# Patient Record
Sex: Male | Born: 1960 | Race: White | Hispanic: No | Marital: Married | Smoking: Current every day smoker
Health system: Southern US, Community
[De-identification: ages and names within clinical notes are randomized; demographics above are authoritative.]

---

## 2015-05-21 ENCOUNTER — Inpatient Hospital Stay: Payer: Managed Care, Other (non HMO)

## 2015-05-21 ENCOUNTER — Observation Stay: Payer: Managed Care, Other (non HMO)

## 2015-05-21 ENCOUNTER — Inpatient Hospital Stay
Admission: EM | Admit: 2015-05-21 | Discharge: 2015-05-23 | DRG: 065 | Disposition: A | Discharge status: Home / Self Care | Payer: Managed Care, Other (non HMO) | Attending: Internal Medicine | Admitting: Internal Medicine

## 2015-05-21 ENCOUNTER — Emergency Department: Payer: Managed Care, Other (non HMO)

## 2015-05-21 ENCOUNTER — Inpatient Hospital Stay: Admit: 2015-05-21 | Payer: Managed Care, Other (non HMO)

## 2015-05-21 ENCOUNTER — Encounter: Payer: Self-pay | Admitting: *Deleted

## 2015-05-21 DIAGNOSIS — F17200 Nicotine dependence, unspecified, uncomplicated: Secondary | ICD-10-CM | POA: Diagnosis present

## 2015-05-21 DIAGNOSIS — R030 Elevated blood-pressure reading, without diagnosis of hypertension: Secondary | ICD-10-CM | POA: Diagnosis present

## 2015-05-21 DIAGNOSIS — Z716 Tobacco abuse counseling: Secondary | ICD-10-CM | POA: Diagnosis not present

## 2015-05-21 DIAGNOSIS — Z79899 Other long term (current) drug therapy: Secondary | ICD-10-CM

## 2015-05-21 DIAGNOSIS — R202 Paresthesia of skin: Secondary | ICD-10-CM

## 2015-05-21 DIAGNOSIS — I6350 Cerebral infarction due to unspecified occlusion or stenosis of unspecified cerebral artery: Secondary | ICD-10-CM | POA: Diagnosis not present

## 2015-05-21 DIAGNOSIS — E785 Hyperlipidemia, unspecified: Secondary | ICD-10-CM | POA: Diagnosis present

## 2015-05-21 DIAGNOSIS — Z8249 Family history of ischemic heart disease and other diseases of the circulatory system: Secondary | ICD-10-CM

## 2015-05-21 DIAGNOSIS — R200 Anesthesia of skin: Secondary | ICD-10-CM | POA: Diagnosis not present

## 2015-05-21 DIAGNOSIS — G8194 Hemiplegia, unspecified affecting left nondominant side: Secondary | ICD-10-CM | POA: Diagnosis present

## 2015-05-21 DIAGNOSIS — I639 Cerebral infarction, unspecified: Secondary | ICD-10-CM | POA: Diagnosis not present

## 2015-05-21 DIAGNOSIS — G459 Transient cerebral ischemic attack, unspecified: Secondary | ICD-10-CM

## 2015-05-21 DIAGNOSIS — I6340 Cerebral infarction due to embolism of unspecified cerebral artery: Secondary | ICD-10-CM | POA: Diagnosis not present

## 2015-05-21 LAB — BASIC METABOLIC PANEL
Anion gap: 9 (ref 5–15)
BUN: 12 mg/dL (ref 6–20)
CHLORIDE: 108 mmol/L (ref 101–111)
CO2: 21 mmol/L — AB (ref 22–32)
Calcium: 9.2 mg/dL (ref 8.9–10.3)
Creatinine, Ser: 1.06 mg/dL (ref 0.61–1.24)
GFR calc non Af Amer: >60 mL/min (ref >60)
Glucose, Bld: 115 mg/dL — ABNORMAL HIGH (ref 65–99)
POTASSIUM: 4.4 mmol/L (ref 3.5–5.1)
SODIUM: 138 mmol/L (ref 135–145)

## 2015-05-21 LAB — LIPID PANEL
CHOLESTEROL: 185 mg/dL (ref 0–200)
HDL: 37 mg/dL — ABNORMAL LOW (ref >40)
LDL Cholesterol: 108 mg/dL — ABNORMAL HIGH (ref 0–99)
TRIGLYCERIDES: 198 mg/dL — AB (ref <150)
Total CHOL/HDL Ratio: 5 RATIO
VLDL: 40 mg/dL (ref 0–40)

## 2015-05-21 LAB — CBC
HCT: 48.2 % (ref 40.0–52.0)
HEMOGLOBIN: 16.9 g/dL (ref 13.0–18.0)
MCH: 34.1 pg — ABNORMAL HIGH (ref 26.0–34.0)
MCHC: 35.2 g/dL (ref 32.0–36.0)
MCV: 96.9 fL (ref 80.0–100.0)
PLATELETS: 194 10*3/uL (ref 150–440)
RBC: 4.97 MIL/uL (ref 4.40–5.90)
RDW: 13.5 % (ref 11.5–14.5)
WBC: 10.5 10*3/uL (ref 3.8–10.6)

## 2015-05-21 LAB — TROPONIN I

## 2015-05-21 MED ORDER — HYDRALAZINE HCL 20 MG/ML IJ SOLN: 10.0000 mg | Freq: Four times a day (QID) | INTRAMUSCULAR | Status: DC | PRN

## 2015-05-21 MED ORDER — SENNOSIDES-DOCUSATE SODIUM 8.6-50 MG PO TABS: 1.0000 | ORAL_TABLET | Freq: Every evening | ORAL | Status: DC | PRN

## 2015-05-21 MED ORDER — ASPIRIN 300 MG RE SUPP: 300.0000 mg | Freq: Every day | RECTAL | Status: DC

## 2015-05-21 MED ORDER — IOPAMIDOL (ISOVUE-370) INJECTION 76%
75.0000 mL | Freq: Once | INTRAVENOUS | Status: AC | PRN
  Administered 2015-05-21: 75 mL via INTRAVENOUS

## 2015-05-21 MED ORDER — GADOBENATE DIMEGLUMINE 529 MG/ML IV SOLN
20.0000 mL | Freq: Once | INTRAVENOUS | Status: AC | PRN
  Administered 2015-05-21: 18 mL via INTRAVENOUS

## 2015-05-21 MED ORDER — ENOXAPARIN SODIUM 40 MG/0.4ML ~~LOC~~ SOLN
40.0000 mg | SUBCUTANEOUS | Status: DC
Start: 2015-05-21 — End: 2015-05-23
  Administered 2015-05-21 – 2015-05-22 (×2): 40 mg via SUBCUTANEOUS
  Filled 2015-05-21 (×2): qty 0.4

## 2015-05-21 MED ORDER — ASPIRIN 325 MG PO TABS
325.0000 mg | ORAL_TABLET | Freq: Every day | ORAL | Status: DC
Start: 2015-05-21 — End: 2015-05-23
  Administered 2015-05-22 – 2015-05-23 (×2): 325 mg via ORAL
  Filled 2015-05-21 (×2): qty 1

## 2015-05-21 MED ORDER — SODIUM CHLORIDE 0.9 % IV SOLN
Freq: Once | INTRAVENOUS | Status: DC
Start: 2015-05-21 — End: 2015-05-21

## 2015-05-21 MED ORDER — SODIUM CHLORIDE 0.9 % IV SOLN
INTRAVENOUS | Status: DC
  Administered 2015-05-21 – 2015-05-22 (×2): via INTRAVENOUS

## 2015-05-21 MED ORDER — STROKE: EARLY STAGES OF RECOVERY BOOK
Freq: Once | Status: AC
  Administered 2015-05-21: 20:00:00

## 2015-05-21 MED ORDER — ATORVASTATIN CALCIUM 20 MG PO TABS
40.0000 mg | ORAL_TABLET | Freq: Every day | ORAL | Status: DC
  Administered 2015-05-21 – 2015-05-22 (×2): 40 mg via ORAL
  Filled 2015-05-21 (×2): qty 2

## 2015-05-21 MED ORDER — ASPIRIN 81 MG PO CHEW
324.0000 mg | CHEWABLE_TABLET | Freq: Once | ORAL | Status: AC
  Administered 2015-05-21: 324 mg via ORAL
  Filled 2015-05-21: qty 4

## 2015-05-21 NOTE — ED Notes (Signed)
Pt hypertensive MD made aware. 

## 2015-05-21 NOTE — ED Provider Notes (Addendum)
[facility] Emergency Department Provider Note   ____________________________________________  Time seen: Approximately 3:23 PM  I have reviewed the triage vital signs and the nursing notes.   HISTORY  Chief Complaint Numbness    HPI Victor Morrison is a 55 y.o. male who reports she's had intermittent trouble with his left hand it feeling numb and clumsy or at times just will not work for him. Friday night he had an episode where his legs became really really weak and he said he was walking on stilts they really wouldn't work properly he fell onto his bed and laid there and when he tried to turn over and touch the touch lamp with his left hand wouldn't work either. This is all improved a lot. Right now he complains of a little stiff feeling in his left hand. Otherwise is normal he's had some pain in his head above his right eye as well. He tells me he's had a little bit of stiffness in his hand today and some chest pain in the past but none at present.  History reviewed. No pertinent past medical history.  There are no active problems to display for this patient.   History reviewed. No pertinent past surgical history.  No current outpatient prescriptions on file.  Allergies Review of patient's allergies indicates no known allergies.  History reviewed. No pertinent family history.  Social History Social History  Substance Use Topics  . Smoking status: Current Every Day Smoker  . Smokeless tobacco: None  . Alcohol Use: None    Review of Systems Constitutional: No fever/chills Eyes: No visual changes. ENT: No sore throat. Cardiovascular: See history of present illness Respiratory: Denies shortness of breath. Gastrointestinal: No abdominal pain.  No nausea, no vomiting.  No diarrhea.  No constipation. Genitourinary: Negative for dysuria. Musculoskeletal: Negative for back pain. Skin: Negative for rash. Neurological: See history of present  illness  10-point ROS otherwise negative.  ____________________________________________   PHYSICAL EXAM:  VITAL SIGNS: ED Triage Vitals  Enc Vitals Group     BP 05/21/15 1014 158/85 mmHg     Pulse Rate 05/21/15 1014 93     Resp 05/21/15 1014 18     Temp 05/21/15 1017 97.8 F (36.6 C)     Temp Source 05/21/15 1014 Oral     SpO2 05/21/15 1014 98 %     Weight 05/21/15 1014 190 lb (86.183 kg)     Height 05/21/15 1014  (1.956 m)     Head Cir --      Peak Flow --      Pain Score 05/21/15 1015 5     Pain Loc --      Pain Edu? --      Excl. in GC? --     Constitutional: Alert and oriented. Well appearing and in no acute distress. Eyes: Conjunctivae are normal. PERRL. EOMI. Fundi somewhat difficult to see but appear normal Head: Atraumatic. Nose: No congestion/rhinnorhea. Mouth/Throat: Mucous membranes are moist.  Oropharynx non-erythematous. Neck: No stridor.  Cardiovascular: Normal rate, regular rhythm. Grossly normal heart sounds.  Good peripheral circulation. Respiratory: Normal respiratory effort.  No retractions. Lungs CTAB. Gastrointestinal: Soft and nontender. No distention. No abdominal bruits. No CVA tenderness. Musculoskeletal: No lower extremity tenderness nor edema.  No joint effusions. Neurologic:  Normal speech and language. No gross focal neurologic deficits are appreciated. Cranial nerves II through XII are intact cerebellar rapid alternating movement in the left hand is slightly slowed patient reports this  is because of stiffness right hand is normal finger to nose and heel to shin are normal bilaterally motor strength is 5 over 5 throughout sensation is intact except for his left index finger feels a little bit numb at the present time. No gait instability. Skin:  Skin is warm, dry and intact. No rash noted. Psychiatric: Mood and affect are normal. Speech and behavior are normal.  ____________________________________________   LABS (all labs ordered are  listed, but only abnormal results are displayed)  Labs Reviewed  BASIC METABOLIC PANEL - Abnormal; Notable for the following:    CO2 21 (*)    Glucose, Bld 115 (*)    All other components within normal limits  CBC - Abnormal; Notable for the following:    MCH 34.1 (*)    All other components within normal limits  TROPONIN I   ____________________________________________  EKG  EKG read and interpreted by me shows normal sinus rhythm at a rate of 77 normal axis essentially normal EKG ____________________________________________  RADIOLOGY  CT the head read as no acute pathology per etiology Chest x-ray shows no acute pathology only emphysema per radiology. ____________________________________________   PROCEDURES   ____________________________________________   INITIAL IMPRESSION / ASSESSMENT AND PLAN / ED COURSE  Pertinent labs & imaging results that were available during my care of the patient were reviewed by me and considered in my medical decision making (see chart for details).  Discussed patient with Dr. Reynolds, Thad Rangereynolds wants to make sure Thad Rangerthat the patient did not have a shower of emboli. ____________________________________________   FINAL CLINICAL IMPRESSION(S) / ED DIAGNOSES  Final diagnoses:  Transient cerebral ischemia, unspecified transient cerebral ischemia type      NEW MEDICATIONS STARTED DURING THIS VISIT:  New Prescriptions   No medications on file     Note:  This document was prepared using Dragon voice recognition software and may include unintentional dictation errors.    Arnaldo NatalPaul F Malinda, MD 05/21/15 1533  Arnaldo NatalPaul F Malinda, MD 05/21/15 [phone]

## 2015-05-21 NOTE — ED Notes (Signed)
States since Thursday pt has had intermentent episodes of left hand numbness and weakness, states Friday night he lost complete control of leg muscles and fell into the bed, also states right eye pain and chest tightness, states dizziness and headache, pt awake and alert, appears in no acute distress

## 2015-05-21 NOTE — ED Notes (Signed)
Pt to CT

## 2015-05-21 NOTE — H&P (Signed)
[facility] - [place] at [facility]   PATIENT NAME: Victor Morrison    MR#:  [id number]  DATE OF BIRTH:  1960-05-26  DATE OF ADMISSION:  05/21/2015  PRIMARY CARE PHYSICIAN: No primary care provider on file.   REQUESTING/REFERRING PHYSICIAN: Dr Darnelle Catalan  CHIEF COMPLAINT:   On and off numbness in the hand and both legs last several days HISTORY OF PRESENT ILLNESS:  [name]  is a 55 y.o. male with a known history of tobacco abuse comes to the emergency room with increasing frequency of numbness in both upper and lower extremities. Patient states he is right-handed and he has been noticing weakness and alternate hands to the point he is not able to do fine motor activity for a few seconds when these episodes happen. 2 days ago he had an episode where his legs both became numb fats like a log and was difficult to lift and walk. The symptoms lasted for 40-50 seconds.  Came to the emergency room CT head was negative. Patient was keen on going home did an MRI of the brain which shows multiple small acute infarcts in the right hemisphere. He is being admitted with acute CVA for further evaluation and management.  Patient received aspirin.   PAST MEDICAL HISTORY:  History reviewed. No pertinent past medical history.  PAST SURGICAL HISTOIRY:  History reviewed. No pertinent past surgical history.  SOCIAL HISTORY:   Social History  Substance Use Topics  . Smoking status: Current Every Day Smoker  . Smokeless tobacco: Not on file  . Alcohol Use: Not on file    FAMILY HISTORY:   Family History  Problem Relation Age of Onset  . Hypertension Father     DRUG ALLERGIES:  No Known Allergies  REVIEW OF SYSTEMS:  Review of Systems  Constitutional: Negative for fever, chills and weight loss.  HENT: Negative for ear discharge, ear pain and nosebleeds.   Eyes: Negative for blurred vision, pain and discharge.  Respiratory: Negative for sputum production, shortness  of breath, wheezing and stridor.   Cardiovascular: Negative for chest pain, palpitations, orthopnea and PND.  Gastrointestinal: Negative for nausea, vomiting, abdominal pain and diarrhea.  Genitourinary: Negative for urgency and frequency.  Musculoskeletal: Negative for back pain and joint pain.  Neurological: Positive for tingling and sensory change. Negative for speech change, focal weakness and weakness.  Psychiatric/Behavioral: Negative for depression and hallucinations. The patient is not nervous/anxious.   All other systems reviewed and are negative.    MEDICATIONS AT HOME:   Prior to Admission medications   Medication Sig Start Date End Date Taking? Authorizing Provider  Chlorphen-Pseudoephed-APAP (CORICIDIN D PO) Take 1 tablet by mouth daily as needed (for cough/congestion).   Yes Historical Provider, MD      VITAL SIGNS:  Blood pressure 134/98, pulse 69, temperature 97.8 F (36.6 C), temperature source Oral, resp. rate 19, height  (1.956 m), weight 86.183 kg (190 lb), SpO2 98 %.  PHYSICAL EXAMINATION:  GENERAL:  55 y.o.-year-old patient lying in the bed with no acute distress.  EYES: Pupils equal, round, reactive to light and accommodation. No scleral icterus. Extraocular muscles intact.  HEENT: Head atraumatic, normocephalic. Oropharynx and nasopharynx clear.  NECK:  Supple, no jugular venous distention. No thyroid enlargement, no tenderness.  LUNGS: Normal breath sounds bilaterally, no wheezing, rales,rhonchi or crepitation. No use of accessory muscles of respiration.  CARDIOVASCULAR: S1, S2 normal. No murmurs, rubs, or gallops.  ABDOMEN: Soft, nontender, nondistended. Bowel sounds present. No organomegaly  or mass.  EXTREMITIES: No pedal edema, cyanosis, or clubbing.  NEUROLOGIC: Cranial nerves II through XII are intact. Muscle strength 5/5 in all extremities. Sensation intact. Gait not checked.  PSYCHIATRIC: The patient is alert and oriented x 3.  SKIN: No obvious  rash, lesion, or ulcer.   LABORATORY PANEL:   CBC  Recent Labs Lab 05/21/15 1017  WBC 10.5  HGB 16.9  HCT 48.2  PLT 194   ------------------------------------------------------------------------------------------------------------------  Chemistries   Recent Labs Lab 05/21/15 1017  NA 138  K 4.4  CL 108  CO2 21*  GLUCOSE 115*  BUN 12  CREATININE 1.06  CALCIUM 9.2   ------------------------------------------------------------------------------------------------------------------  Cardiac Enzymes  Recent Labs Lab 05/21/15 1017  TROPONINI <0.03   ------------------------------------------------------------------------------------------------------------------  RADIOLOGY:  Dg Eye Foreign Body  05/21/2015  CLINICAL DATA:  Metal working/exposure; clearance prior to MRI EXAM: ORBITS FOR FOREIGN BODY - 2 VIEW COMPARISON:  CT scan of the head 05/21/2015 FINDINGS: There is no evidence of metallic foreign body within the orbits. No significant bone abnormality identified. IMPRESSION: No evidence of metallic foreign body within the orbits. Electronically Signed   By: Natasha Mead M.D.   On: 05/21/2015 16:24   Dg Chest 2 View  05/21/2015  CLINICAL DATA:  Chest pain. Dizziness and headache. Left hand numbness and weakness. EXAM: CHEST  2 VIEW COMPARISON:  None. FINDINGS: The heart size and mediastinal contours are within normal limits. The lungs are hyperinflated with flattening of diaphragm suggesting emphysema. No infiltrates or effusions. Surgical staples in the right middle lobe. No acute bone abnormality. IMPRESSION: No acute abnormality.  Emphysema. Electronically Signed   By: Francene Boyers M.D.   On: 05/21/2015 11:18   Ct Head Wo Contrast  05/21/2015  CLINICAL DATA:  Intermittent episodes of left hand numbness and weakness since 05/17/2015. The patient lost control of his legs resulting in a fall on 05/18/2015. Initial encounter. EXAM: CT HEAD WITHOUT CONTRAST TECHNIQUE:  Contiguous axial images were obtained from the base of the skull through the vertex without intravenous contrast. COMPARISON:  None. FINDINGS: The brain appears normal without hemorrhage, infarct, mass lesion, mass effect, midline shift or abnormal extra-axial fluid collection. No hydrocephalus or pneumocephalus. The calvarium is intact. Imaged paranasal sinuses and mastoid air cells are clear. IMPRESSION: Negative head CT. Electronically Signed   By: Drusilla Kanner M.D.   On: 05/21/2015 11:14   Mr Lodema Pilot Contrast  05/21/2015  CLINICAL DATA:  55 year old male Male with intermittent left hand clumsiness. Recent episode of lower extremity weakness. Numbness and tingling. Symptoms reportedly since 05/17/2015. Initial encounter. EXAM: MRI HEAD WITHOUT AND WITH CONTRAST TECHNIQUE: Multiplanar, multiecho pulse sequences of the brain and surrounding structures were obtained without and with intravenous contrast. CONTRAST:  18mL MULTIHANCE GADOBENATE DIMEGLUMINE 529 MG/ML IV SOLN COMPARISON:  Head CT without contrast 1045 hours today. FINDINGS: Numerous small areas of cortical and white matter restricted diffusion in the right hemisphere. There is involvement of all lobes of the right hemisphere. The deep gray matter is relatively spared, although there is subtle T2 and diffusion abnormality in the right caudate nucleus. Associated T2 and FLAIR hyperintensity without evidence of hemorrhage. No mass effect at this time. No contralateral left hemisphere or posterior fossa diffusion abnormality. Major intracranial vascular flow voids are preserved, however, there is suggestion of some abnormal FLAIR signal in right MCA branches on series 9, image 12. Furthermore, there is asymmetric flow related signal in the right ICA on precontrast T1 imaging. Following contrast there  is no abnormal enhancement. No dural thickening. No ventriculomegaly. No intracranial mass lesion. Outside of the acute findings above gray and white  matter signal appears within normal limits. Negative pituitary, thalami, brainstem, cerebellum, and cervicomedullary junction. No cortical encephalomalacia or chronic cerebral blood products identified. Visible internal auditory structures appear normal. Negative paranasal sinuses and mastoids. Postoperative changes to both globes. Negative scalp soft tissues. Normal bone marrow signal. Negative for age visualized cervical spine. IMPRESSION: 1. Scattered small acute infarcts throughout the right hemisphere. No associated hemorrhage or mass effect. 2. Suggestion of abnormal flow in the right ICA and MCA on these images. CTA head and neck would evaluate further. Electronically Signed   By: Odessa FlemingH  Hall M.D.   On: 05/21/2015 17:18    EKG:  NSR  IMPRESSION AND PLAN:  Jameil Mcmahen  is a 55 y.o. male with a known history of tobacco abuse comes to the emergency room with increasing frequency of numbness in both upper and lower extremities. Patient states he is right-handed and he has been noticing weakness and alternate hands to the point he is not able to do fine motor activity for a few seconds when these episodes happen. 2 days ago he had an episode where his legs both became numb fats like a log and was difficult to lift and walk. The symptoms lasted for 40-50 seconds.  1. acute CVA -Patient came in with episode of intermittent numbness in both upper and lower extremity last several days. -MRI suggestive of small multiple infarcts in the right hemisphere. Admit to medical floor with telemetry monitoring -Daily aspirin -We'll start empirically on Lipitor -Neurology consultation  2. Elevated blood pressure without diagnosis of hypertension -We'll allow permissive hypertension at present in the setting of acute stroke -When necessary IV hydralazine if systolic greater than 180 and diastolic greater than 105  3. Tobacco abuse Smoking cessation advice. About 4 minutes spent  4. DVT prophylaxis  subcutaneous Lovenox    All the records are reviewed and case discussed with ED provider. Management plans discussed with the patient, family and they are in agreement.  CODE STATUS: FULL TOTAL TIME TAKING CARE OF THIS PATIENT: 50 minutes.    Janeene Sand M.D on 05/21/2015 at 5:45 PM  Between 7am to 6pm - Pager - [phone]  After 6pm go to [email or web address] - password [email or web address] [facility]  [facility]  Office  [phone]  CC: Primary care physician; No primary care provider on file.

## 2015-05-22 ENCOUNTER — Inpatient Hospital Stay: Payer: Managed Care, Other (non HMO)

## 2015-05-22 ENCOUNTER — Inpatient Hospital Stay (HOSPITAL_COMMUNITY)
Admit: 2015-05-22 | Discharge: 2015-05-22 | Disposition: A | Discharge status: Home / Self Care | Payer: Managed Care, Other (non HMO) | Attending: Internal Medicine | Admitting: Internal Medicine

## 2015-05-22 DIAGNOSIS — I6350 Cerebral infarction due to unspecified occlusion or stenosis of unspecified cerebral artery: Secondary | ICD-10-CM

## 2015-05-22 DIAGNOSIS — I639 Cerebral infarction, unspecified: Secondary | ICD-10-CM

## 2015-05-22 LAB — LIPID PANEL
CHOLESTEROL: 175 mg/dL (ref 0–200)
HDL: 27 mg/dL — ABNORMAL LOW (ref >40)
LDL Cholesterol: 96 mg/dL (ref 0–99)
Total CHOL/HDL Ratio: 6.5 RATIO
Triglycerides: 260 mg/dL — ABNORMAL HIGH (ref <150)
VLDL: 52 mg/dL — AB (ref 0–40)

## 2015-05-22 LAB — ECHOCARDIOGRAM COMPLETE
Height: 77 in
WEIGHTICAEL: 3184 [oz_av]

## 2015-05-22 MED ORDER — ACETAMINOPHEN 325 MG PO TABS
650.0000 mg | ORAL_TABLET | Freq: Three times a day (TID) | ORAL | Status: DC | PRN
  Administered 2015-05-22: 07:00:00 650 mg via ORAL
  Filled 2015-05-22: qty 2

## 2015-05-22 NOTE — Progress Notes (Signed)
*  PRELIMINARY RESULTS* °Echocardiogram °2D Echocardiogram has been performed. ° °Victor Morrison °05/22/2015, 9:53 AM °

## 2015-05-22 NOTE — Care Management (Signed)
Presented to the emergency room with the diagnosis of acute CVA. Lives with wife, Maxie BetterLila [phone]( [phone]). [place] [employer] [place].  Takes care of all basic and instrumental activities of daily living himself, drives. [place]. States he has no primary care physician at home. Encouraged to obtain primary care physician when he returns home. 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 Serena CroissantBrenda S Carah Barrientes RN MSN [facility] Care Management [phone]

## 2015-05-22 NOTE — Evaluation (Signed)
Physical Therapy Evaluation Patient Details Name: Victor Morrison MRN: [id number] DOB: 08/22/1960 Today's Date: 05/22/2015   History of Present Illness  Victor Morrison is a 55 y.o. male with a known history of tobacco abuse comes to the emergency room with increasing frequency of numbness in both upper and lower extremities. Patient states he is right-handed and he has been noticing weakness and alternate hands to the point he is not able to do fine motor activity for a few seconds when these episodes happen. 2 days ago he had an episode where his legs both became numb and felt like a  log and was difficult to lift and walk. The symptoms lasted for 40-50 seconds. MRI positive for several small infarcts in R hemisphere of brain most likely from MCA branch.  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  Morrison lives with his wife (who is currently in [place]) in 1 level home with level entry.  Currently Morrison is independent with bed mobility and transfers and SBA to CGA with ambulation without AD.  Morrison demonstrates increased R LE external rotation (compared to L LE) and occasionally catching R heel during swing phase (Morrison reports this is new from baseline).  Morrison also demonstrates higher level balance deficits with balance testing.  Morrison would benefit from skilled Morrison to address noted impairments and functional limitations.  Recommend Morrison discharge to home with follow up with PCP for OP Morrison for higher level balance when medically appropriate.     Follow Up Recommendations  (Follow-up with PCP for OP Morrison for higher level balance)    Equipment Recommendations  None recommended by Morrison    Recommendations for Other Services       Precautions / Restrictions Precautions Precautions:  (moderate fall risk) Restrictions Weight Bearing Restrictions: No      Mobility  Bed Mobility Overal bed mobility: Independent             General bed  mobility comments: Supine to/from sit  Transfers Overall transfer level: Independent Equipment used: None             General transfer comment: steady without loss of balance  Ambulation/Gait Ambulation/Gait assistance: Supervision;Min guard Ambulation Distance (Feet): 360 Feet Assistive device: None Gait Pattern/deviations: Step-through pattern   Gait velocity interpretation: at or above normal speed for age/gender General Gait Details: increased lateral sway B; R LE external rotation and occasionally catching R heel during swing phase  Stairs            Wheelchair Mobility    Modified Rankin (Stroke Patients Only)       Balance Overall balance assessment: Needs assistance Sitting-balance support: No upper extremity supported;Feet supported Sitting balance-Leahy Scale: Normal     Standing balance support: No upper extremity supported Standing balance-Leahy Scale: Good    Ambulation with head turns R/L (lateral loss of balance B but able to self correct) and head turns up/down (steady). Ambulation increasing/decreasing speed with limited change in speed but steady. Mild loss of balance with ambulation and turning 180 degrees and stopping (Morrison able to self correct).               Standardized Balance Assessment Standardized Balance Assessment :  (Tinetti balance assessment:  26/28 (1/2 foot clearance; 1/2 turning 360 degrees d/t loss of balance backwards))           Pertinent Vitals/Pain Pain Assessment: No/denies pain  Vitals (HR and O2)  stable and WFL throughout treatment session.     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  His wife is currently in [place].     Hand Dominance   Dominant Hand: Right    Extremity/Trunk Assessment   Upper Extremity Assessment: Defer to OT evaluation           Lower Extremity Assessment: RLE deficits/detail;LLE deficits/detail (intact B LE sensation and coordination and proprioception) RLE Deficits / Details: 4+/5 strength B hip flexion, knee flexion/extension, and DF LLE Deficits / Details: 4+/5 strength B hip flexion, knee flexion/extension, and DF  Cervical / Trunk Assessment: Normal  Communication   Communication: No difficulties  Cognition Arousal/Alertness: Awake/alert Behavior During Therapy: WFL for tasks assessed/performed Overall Cognitive Status: Within Functional Limits for tasks assessed                      General Comments  Morrison agreeable to Morrison session.    Exercises   Balance      Assessment/Plan    Morrison Assessment Patient needs continued Morrison services  Morrison Diagnosis Difficulty walking   Morrison Problem List Decreased balance  Morrison Treatment Interventions DME instruction;Gait training;Functional mobility training;Therapeutic activities;Therapeutic exercise;Balance training;Patient/family education   Morrison Goals (Current goals can be found in the Care Plan section) Acute Rehab Morrison Goals Patient Stated Goal: to go home Morrison Goal Formulation: With patient Time For Goal Achievement: 06/05/15 Potential to Achieve Goals: Good    Frequency 7X/week   Barriers to discharge        Co-evaluation               End of Session Equipment Utilized During Treatment: Gait belt Activity Tolerance: Patient tolerated treatment well Patient left: in bed;with call bell/phone within reach Nurse Communication: Mobility status;Precautions         Time: 1610-9604 Morrison Time Calculation (min) (ACUTE ONLY): 22 min   Charges:   Morrison Evaluation $Morrison Eval Low Complexity: 1 Procedure Morrison  Treatments $Neuromuscular Re-education: 8-22 mins   Morrison G CodesHendricks Morrison 05/28/2015, 3:46 PM Victor Morrison [phone]

## 2015-05-22 NOTE — Consult Note (Signed)
Referring Physician: Renae Gloss    Chief Complaint: Left hand numbness  HPI: Victor Morrison is an 55 y.o. male who reports that at about midday on 4/27 he began to note numbness in his left hand.  He also noted some mild confusion and difficulty with speech.  Later that evening he found himself unable to use his legs when he was getting up to go to the bathroom.  When he got back in the bed he had difficulty turning over due to his lower extremity weakness and noted that he was unable to use his left hand as well.  His legs improved by the next day but he has continued to have difficulty with his left hand and with his speech and presented for evaluation.  Initial NIHSS of 0.  Date last known well: 05/17/2015 Time last known well: Time: 12:00 tPA Given: No: Outside time window  mRankin:0  History reviewed. No pertinent past medical history.  History reviewed. No pertinent past surgical history.  Family History  Problem Relation Age of Onset  . Hypertension Father    Social History:  reports that he has been smoking.  He does not have any smokeless tobacco history on file. His alcohol and drug histories are not on file.  Allergies: No Known Allergies  Medications:  I have reviewed the patient's current medications. Prior to Admission:  Prescriptions prior to admission  Medication Sig Dispense Refill Last Dose  . Chlorphen-Pseudoephed-APAP (CORICIDIN D PO) Take 1 tablet by mouth daily as needed (for cough/congestion).   Past Week at Unknown time   Scheduled: . aspirin  300 mg Rectal Daily   Or  . aspirin  325 mg Oral Daily  . atorvastatin  40 mg Oral q1800  . enoxaparin (LOVENOX) injection  40 mg Subcutaneous Q24H    ROS: History obtained from the patient  General ROS: negative for - chills, fatigue, fever, night sweats, weight gain or weight loss Psychological ROS: negative for - behavioral disorder, hallucinations, memory difficulties, mood swings or suicidal  ideation Ophthalmic ROS: negative for - blurry vision, double vision, eye pain or loss of vision ENT ROS: negative for - epistaxis, nasal discharge, oral lesions, sore throat, tinnitus or vertigo Allergy and Immunology ROS: negative for - hives or itchy/watery eyes Hematological and Lymphatic ROS: negative for - bleeding problems, bruising or swollen lymph nodes Endocrine ROS: negative for - galactorrhea, hair pattern changes, polydipsia/polyuria or temperature intolerance Respiratory ROS: negative for - cough, hemoptysis, shortness of breath or wheezing Cardiovascular ROS: negative for - chest pain, dyspnea on exertion, edema or irregular heartbeat Gastrointestinal ROS: negative for - abdominal pain, diarrhea, hematemesis, nausea/vomiting or stool incontinence Genito-Urinary ROS: negative for - dysuria, hematuria, incontinence or urinary frequency/urgency Musculoskeletal ROS: negative for - joint swelling or muscular weakness Neurological ROS: as noted in HPI Dermatological ROS: negative for rash and skin lesion changes  Physical Examination: Blood pressure 118/66, pulse 66, temperature 98.7 F (37.1 C), temperature source Oral, resp. rate 20, height 6\' 5"  (1.956 m), weight 90.266 kg (199 lb), SpO2 95 %.  HEENT-  Normocephalic, no lesions, without obvious abnormality.  Normal external eye and conjunctiva.  Normal TM's bilaterally.  Normal auditory canals and external ears. Normal external nose, mucus membranes and septum.  Normal pharynx. Cardiovascular- S1, S2 normal, pulses palpable throughout   Lungs- chest clear, no wheezing, rales, normal symmetric air entry Abdomen- soft, non-tender; bowel sounds normal; no masses,  no organomegaly Extremities- no edema Lymph-no adenopathy palpable Musculoskeletal-no joint tenderness, deformity  or swelling Skin-warm and dry, no hyperpigmentation, vitiligo, or suspicious lesions  Neurological Examination Mental Status: Alert, oriented, thought  content appropriate.  Emotionally labile.  Speech fluent but patient pauses often as if trying to get his thoughts together.  Able to follow 3 step commands but requires reinforcement. Cranial Nerves: II: Discs flat bilaterally; Visual fields grossly normal, pupils equal, round, reactive to light and accommodation III,IV, VI: ptosis not present, extra-ocular motions intact bilaterally V,VII: decreased left NLF, facial light touch sensation normal bilaterally VIII: hearing normal bilaterally IX,X: gag reflex present XI: bilateral shoulder shrug XII: midline tongue extension Motor: Right : Upper extremity   5/5    Left:     Upper extremity   5/5  Lower extremity   5/5     Lower extremity   5/5 Tone and bulk:normal tone throughout; no atrophy noted Sensory: Pinprick and light touch intact throughout, bilaterally Deep Tendon Reflexes: 1+ and symmetric with absent AJ's bilaterally Plantars: Right: equivocal   Left: downgoing Cerebellar: Normal finger-to-nose, normal rapid alternating movements and normal heel-to-shin test Gait: not tested due to safety concerns    Laboratory Studies:  Basic Metabolic Panel:  Recent Labs Lab 05/21/15 1017  NA 138  K 4.4  CL 108  CO2 21*  GLUCOSE 115*  BUN 12  CREATININE 1.06  CALCIUM 9.2    Liver Function Tests: No results for input(s): AST, ALT, ALKPHOS, BILITOT, PROT, ALBUMIN in the last 168 hours. No results for input(s): LIPASE, AMYLASE in the last 168 hours. No results for input(s): AMMONIA in the last 168 hours.  CBC:  Recent Labs Lab 05/21/15 1017  WBC 10.5  HGB 16.9  HCT 48.2  MCV 96.9  PLT 194    Cardiac Enzymes:  Recent Labs Lab 05/21/15 1017  TROPONINI <0.03    BNP: Invalid input(s): POCBNP  CBG: No results for input(s): GLUCAP in the last 168 hours.  Microbiology: No results found for this or any previous visit.  Coagulation Studies: No results for input(s): LABPROT, INR in the last 72  hours.  Urinalysis: No results for input(s): COLORURINE, LABSPEC, PHURINE, GLUCOSEU, HGBUR, BILIRUBINUR, KETONESUR, PROTEINUR, UROBILINOGEN, NITRITE, LEUKOCYTESUR in the last 168 hours.  Invalid input(s): APPERANCEUR  Lipid Panel:    Component Value Date/Time   CHOL 175 05/22/2015 0522   TRIG 260* 05/22/2015 0522   HDL 27* 05/22/2015 0522   CHOLHDL 6.5 05/22/2015 0522   VLDL 52* 05/22/2015 0522   LDLCALC 96 05/22/2015 0522    HgbA1C: No results found for: HGBA1C  Urine Drug Screen:  No results found for: LABOPIA, COCAINSCRNUR, LABBENZ, AMPHETMU, THCU, LABBARB  Alcohol Level: No results for input(s): ETH in the last 168 hours.  Other results: EKG: normal sinus rhythm at 85 bpm.  Imaging: Dg Eye Foreign Body  05/21/2015  CLINICAL DATA:  Metal working/exposure; clearance prior to MRI EXAM: ORBITS FOR FOREIGN BODY - 2 VIEW COMPARISON:  CT scan of the head 05/21/2015 FINDINGS: There is no evidence of metallic foreign body within the orbits. No significant bone abnormality identified. IMPRESSION: No evidence of metallic foreign body within the orbits. Electronically Signed   By: Natasha Mead M.D.   On: 05/21/2015 16:24   Dg Chest 2 View  05/21/2015  CLINICAL DATA:  Chest pain. Dizziness and headache. Left hand numbness and weakness. EXAM: CHEST  2 VIEW COMPARISON:  None. FINDINGS: The heart size and mediastinal contours are within normal limits. The lungs are hyperinflated with flattening of diaphragm suggesting emphysema. No infiltrates or  effusions. Surgical staples in the right middle lobe. No acute bone abnormality. IMPRESSION: No acute abnormality.  Emphysema. Electronically Signed   By: Francene BoyersJames  Maxwell M.D.   On: 05/21/2015 11:18   Ct Head Wo Contrast  05/21/2015  CLINICAL DATA:  Intermittent episodes of left hand numbness and weakness since 05/17/2015. The patient lost control of his legs resulting in a fall on 05/18/2015. Initial encounter. EXAM: CT HEAD WITHOUT CONTRAST TECHNIQUE:  Contiguous axial images were obtained from the base of the skull through the vertex without intravenous contrast. COMPARISON:  None. FINDINGS: The brain appears normal without hemorrhage, infarct, mass lesion, mass effect, midline shift or abnormal extra-axial fluid collection. No hydrocephalus or pneumocephalus. The calvarium is intact. Imaged paranasal sinuses and mastoid air cells are clear. IMPRESSION: Negative head CT. Electronically Signed   By: Drusilla Kannerhomas  Dalessio M.D.   On: 05/21/2015 11:14   Ct Angio Neck W/cm &/or Wo/cm  05/21/2015  CLINICAL DATA:  55 year old male with increasing frequency of bilateral extremity numbness. Acute infarcts scattered throughout the right hemisphere on brain MRI today. EXAM: CT ANGIOGRAPHY NECK TECHNIQUE: Multidetector CT imaging of the neck was performed using the standard protocol during bolus administration of intravenous contrast. Multiplanar CT image reconstructions and MIPs were obtained to evaluate the vascular anatomy. Carotid stenosis measurements (when applicable) are obtained utilizing NASCET criteria, using the distal internal carotid diameter as the denominator. CONTRAST:  75 mL Isovue 370 COMPARISON:  Brain MRI 1647 hours today. FINDINGS: Skeleton: Partially visible levoconvex upper thoracic scoliosis. Mild reversal of cervical lordosis. No acute osseous abnormality identified. Other neck: Scattered centrilobular emphysema in the upper lungs. Small volume of retained secretions in the trachea (series 4, image 195). No superior mediastinal lymphadenopathy. Negative thyroid, larynx, pharynx, parapharyngeal spaces, retropharyngeal space, sublingual space, submandibular glands and parotid glands. No cervical lymphadenopathy. Negative visualized orbit and brain parenchyma. Aortic arch: 3 vessel arch configuration. Mild arch soft plaque. No great vessel origin stenosis. Right carotid system: Negative right CCA origin. Negative right CCA. Negative right carotid  bifurcation. Negative cervical right ICA. The visible right ICA siphon, to the level of the vertical petrous segment, is patent and appears normal. Left carotid system: Negative left CCA. Negative left carotid bifurcation. Negative cervical left ICA. Negative visible left ICA siphon. Vertebral arteries: No proximal right subclavian artery stenosis. Normal right vertebral artery origin. Normal right vertebral artery to the PICA origin. The vessel is mildly diminutive distal to the PICA, but without stenosis to the vertebrobasilar junction. Negative visible basilar artery. No proximal left subclavian artery stenosis. Normal left vertebral artery origin. Negative left vertebral artery to the vertebrobasilar junction. Normal left PICA origin. IMPRESSION: 1. Negative CTA neck aside from mild soft atherosclerotic plaque at the aortic arch. 2. Distal right ICA and intracranial circulation not evaluated on this exam. Electronically Signed   By: Odessa FlemingH  Hall M.D.   On: 05/21/2015 18:37   Mr Laqueta JeanBrain W ZOWo Contrast  05/21/2015  CLINICAL DATA:  55 year old male Male with intermittent left hand clumsiness. Recent episode of lower extremity weakness. Numbness and tingling. Symptoms reportedly since 05/17/2015. Initial encounter. EXAM: MRI HEAD WITHOUT AND WITH CONTRAST TECHNIQUE: Multiplanar, multiecho pulse sequences of the brain and surrounding structures were obtained without and with intravenous contrast. CONTRAST:  18mL MULTIHANCE GADOBENATE DIMEGLUMINE 529 MG/ML IV SOLN COMPARISON:  Head CT without contrast 1045 hours today. FINDINGS: Numerous small areas of cortical and white matter restricted diffusion in the right hemisphere. There is involvement of all lobes of the right hemisphere.  The deep gray matter is relatively spared, although there is subtle T2 and diffusion abnormality in the right caudate nucleus. Associated T2 and FLAIR hyperintensity without evidence of hemorrhage. No mass effect at this time. No contralateral  left hemisphere or posterior fossa diffusion abnormality. Major intracranial vascular flow voids are preserved, however, there is suggestion of some abnormal FLAIR signal in right MCA branches on series 9, image 12. Furthermore, there is asymmetric flow related signal in the right ICA on precontrast T1 imaging. Following contrast there is no abnormal enhancement. No dural thickening. No ventriculomegaly. No intracranial mass lesion. Outside of the acute findings above gray and white matter signal appears within normal limits. Negative pituitary, thalami, brainstem, cerebellum, and cervicomedullary junction. No cortical encephalomalacia or chronic cerebral blood products identified. Visible internal auditory structures appear normal. Negative paranasal sinuses and mastoids. Postoperative changes to both globes. Negative scalp soft tissues. Normal bone marrow signal. Negative for age visualized cervical spine. IMPRESSION: 1. Scattered small acute infarcts throughout the right hemisphere. No associated hemorrhage or mass effect. 2. Suggestion of abnormal flow in the right ICA and MCA on these images. CTA head and neck would evaluate further. Electronically Signed   By: Odessa Fleming M.D.   On: 05/21/2015 17:18    Assessment: 55 y.o. male presenting with complaints of speech problems, difficulty with gait and left hand numbness.  Most symptoms have improved other than his speech.  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  No critical stenosis noted on CTA.  Echocardiogram is pending.  A1c pending.  LDL 96.  Patient on no antiplatelet therapy at home.    Stroke Risk Factors - hyperlipidemia and smoking  Plan: 1. PT consult, OT consult, Speech consult 2. Echocardiogram follow up 3. Prophylactic therapy-Antiplatelet med: Aspirin - dose 325mg  daily 4. Smoking cessation discussed 5. Telemetry monitoring 6. Frequent neuro checks 7. Agree with  initiation of statin with target LDL of 70.   8. Patient to follow up with neurology on an outpatient basis  Discussion had with wife who is in [place].  Her questions were addressed.     Thana Farr, MD Neurology [phone] 05/22/2015, 11:13 AM

## 2015-05-22 NOTE — Progress Notes (Signed)
Speech Therapy Note: received order, reviewed chart notes; consulted NSG re: pt's status this morning. Met w/ pt who denied any trouble swallowing (he had recently finished breakfast meal). Pt is on a regular diet consistency. Pt conversed at conversational level w/ no gross speech-language deficits noted; speech clear/intelligible. No apparent skilled ST services indicated at this time. NSG to reconsult if any change in status while admitted; pt agreed. NSG updated.

## 2015-05-22 NOTE — Progress Notes (Signed)
OT Cancellation Note  Patient Details Name: Tami LinGene G Truszkowski MRN: [id number] DOB: 04/14/1960   Cancelled Treatment:    Reason Eval/Treat Not Completed: Patient at procedure or test/ unavailable   Olegario MessierElaine Janylah Belgrave, MS, OTR/L  Olegario MessierElaine Nabila Albarracin 05/22/2015, 10:24 AM

## 2015-05-22 NOTE — Evaluation (Signed)
Occupational Therapy Evaluation Patient Details Name: Victor Morrison MRN: [id number] DOB: 06/11/1960 Today's Date: 05/22/2015    History of Present Illness Victor Morrison is a 55 y.o. male with a known history of tobacco abuse comes to the emergency room with increasing frequency of numbness in both upper and lower extremities. Patient states he is right-handed and he has been noticing weakness and alternate hands to the point he is not able to do fine motor activity for a few seconds when these episodes happen. 2 days ago he had an episode where his legs both became numb and felt like a  log and was difficult to lift and walk. The symptoms lasted for 40-50 seconds. MRI positive for several small infarcts in R hemisphere of brain most likely from MCA branch.   Clinical Impression   Pt is 55 year old male who presents with numbness in L hand and BLEs and headache which have all resolved.  He does not present with any visual changes.  Bilateral UEs and hand with intact sensation for light touch, hot/cold and proprioception.  He has a very mild delay in motor planning when pointing with left hand to right and pt educated in using vision to compensate. He does not have any pain.  He is at baseline for ADLs and no LOB standing or when leaning forward.  Very mild balance deficit when standing for 5 minutes with left hand holding onto counter.  He is able to ambulate to bathroom with supervision only and use toilet and perform hygiene independently.  [place] is [place] and [place] in [place] [employer] [place] off [address].  Pt seen for eval only and no further OT needed or recommended.  Rec he use a commode chair over toilet if balance issues get worse in order to prevent falls.  NSG, PT and SW updated about rec.       Follow Up Recommendations  No OT follow up    Equipment Recommendations       Recommendations for Other Services       Precautions / Restrictions  Precautions Precautions:  (per Trey PaulaJeff from NSG he is no longer on fall precautions and is allowed to ambulate to and from bathroom by himself) Restrictions Weight Bearing Restrictions: No      Mobility Bed Mobility                  Transfers                      Balance                                            ADL Overall ADL's : At baseline                                       General ADL Comments: Pt is currently slightly weaker and slight decrease in endurance but at baseline for ADLs.       Vision     Perception     Praxis      Pertinent Vitals/Pain Pain Assessment: No/denies pain     Hand Dominance Right   Extremity/Trunk Assessment Upper Extremity Assessment Upper Extremity Assessment: Overall WFL for tasks assessed (  intact light touch, hot/cold, proprioception and gross and fine motor skills bilaterally)   Lower Extremity Assessment Lower Extremity Assessment: Defer to PT evaluation       Communication Communication Communication: No difficulties   Cognition Arousal/Alertness: Awake/alert Behavior During Therapy: WFL for tasks assessed/performed Overall Cognitive Status: Within Functional Limits for tasks assessed                     General Comments       Exercises       Shoulder Instructions      Home Living Family/patient expects to be discharged to:: Private residence Living Arrangements: Spouse/significant other   Type of Home: House       Home Layout: One level     Bathroom Shower/Tub: Tub/shower unit Shower/tub characteristics: Door Firefighter: Standard Bathroom Accessibility: Yes How Accessible: Accessible via walker Home Equipment: None          Prior Functioning/Environment Level of Independence: Independent        Comments: was working full time as a Fish farm manager and is from [place], [place] and working on a job near [employer] station [address].  His wife  is currently in [place].    OT Diagnosis:     OT Problem List:     OT Treatment/Interventions:      OT Goals(Current goals can be found in the care plan section)    OT Frequency:     Barriers to D/C:            Co-evaluation              End of Session Equipment Utilized During Treatment: Gait belt Nurse Communication:  (pt sitting EOB and confirmed that he did not need bed alarm and was able to move around and to bathroom without assist or calling for nurse and nurse confirmed this)  Activity Tolerance: Patient tolerated treatment well Patient left: in bed;with call bell/phone within reach   Time:  -    Charges:    G-Codes:     Susanne Borders, OTR/L ascom [phone] 05/22/2015, 2:08 PM

## 2015-05-22 NOTE — Progress Notes (Signed)
Patient ID: Victor Morrison, male   DOB: 05-12-1960, 55 y.o.   MRN: [id number] [facility] PROGRESS NOTE  Victor Morrison ONG:[id number] DOB: 02-09-1960 DOA: 05/21/2015 PCP: No primary care provider on file.  HPI/Subjective: Patient presented with left hand numbness and headache and confusion. His left hand numbness has resolved. His headache is gone. His confusion is improved. He states his thinking is better.  Objective: Filed Vitals:   05/22/15 0904 05/22/15 1218  BP: 118/66 145/87  Pulse: 66 77  Temp:  98.6 F (37 C)  Resp: 20 20   No intake or output data in the 24 hours ending 05/22/15 1252 Filed Weights   05/21/15 1014 05/21/15 1833  Weight: 86.183 kg (190 lb) 90.266 kg (199 lb)    ROS: Review of Systems  Constitutional: Negative for fever and chills.  Eyes: Negative for blurred vision.  Respiratory: Negative for cough and shortness of breath.   Cardiovascular: Negative for chest pain.  Gastrointestinal: Negative for nausea, vomiting, abdominal pain, diarrhea and constipation.  Genitourinary: Negative for dysuria.  Musculoskeletal: Negative for joint pain.  Neurological: Positive for sensory change and headaches. Negative for dizziness.   Exam: Physical Exam  Constitutional: He is oriented to person, place, and time.  HENT:  Nose: No mucosal edema.  Mouth/Throat: No oropharyngeal exudate or posterior oropharyngeal edema.  Eyes: Conjunctivae, EOM and lids are normal. Pupils are equal, round, and reactive to light.  Neck: No JVD present. Carotid bruit is not present. No edema present. No thyroid mass and no thyromegaly present.  Cardiovascular: S1 normal and S2 normal.  Exam reveals no gallop.   No murmur heard. Pulses:      Dorsalis pedis pulses are 2+ on the right side, and 2+ on the left side.  Respiratory: No respiratory distress. He has no wheezes. He has no rhonchi. He has no rales.  GI: Soft. Bowel sounds are normal. There is no tenderness.   Musculoskeletal:       Right ankle: He exhibits no swelling.       Left ankle: He exhibits no swelling.  Lymphadenopathy:    He has no cervical adenopathy.  Neurological: He is alert and oriented to person, place, and time. No cranial nerve deficit.  Power 5 out of 5 upper and lower extremities  Skin: Skin is warm. No rash noted. Nails show no clubbing.  Psychiatric: He has a normal mood and affect.      Data Reviewed: Basic Metabolic Panel:  Recent Labs Lab 05/21/15 1017  NA 138  K 4.4  CL 108  CO2 21*  GLUCOSE 115*  BUN 12  CREATININE 1.06  CALCIUM 9.2  CBC:  Recent Labs Lab 05/21/15 1017  WBC 10.5  HGB 16.9  HCT 48.2  MCV 96.9  PLT 194   Cardiac Enzymes:  Recent Labs Lab 05/21/15 1017  TROPONINI <0.03     Studies: Dg Eye Foreign Body  05/21/2015  CLINICAL DATA:  Metal working/exposure; clearance prior to MRI EXAM: ORBITS FOR FOREIGN BODY - 2 VIEW COMPARISON:  CT scan of the head 05/21/2015 FINDINGS: There is no evidence of metallic foreign body within the orbits. No significant bone abnormality identified. IMPRESSION: No evidence of metallic foreign body within the orbits. Electronically Signed   By: Victor Morrison M.D.   On: 05/21/2015 16:24   Dg Chest 2 View  05/21/2015  CLINICAL DATA:  Chest pain. Dizziness and headache. Left hand numbness and weakness. EXAM: CHEST  2 VIEW COMPARISON:  None. FINDINGS:  The heart size and mediastinal contours are within normal limits. The lungs are hyperinflated with flattening of diaphragm suggesting emphysema. No infiltrates or effusions. Surgical staples in the right middle lobe. No acute bone abnormality. IMPRESSION: No acute abnormality.  Emphysema. Electronically Signed   By: Victor Morrison M.D.   On: 05/21/2015 11:18   Ct Head Wo Contrast  05/21/2015  CLINICAL DATA:  Intermittent episodes of left hand numbness and weakness since 05/17/2015. The patient lost control of his legs resulting in a fall on 05/18/2015. Initial  encounter. EXAM: CT HEAD WITHOUT CONTRAST TECHNIQUE: Contiguous axial images were obtained from the base of the skull through the vertex without intravenous contrast. COMPARISON:  None. FINDINGS: The brain appears normal without hemorrhage, infarct, mass lesion, mass effect, midline shift or abnormal extra-axial fluid collection. No hydrocephalus or pneumocephalus. The calvarium is intact. Imaged paranasal sinuses and mastoid air cells are clear. IMPRESSION: Negative head CT. Electronically Signed   By: Victor Morrison M.D.   On: 05/21/2015 11:14   Ct Angio Neck W/cm &/or Wo/cm  05/21/2015  CLINICAL DATA:  55 year old male with increasing frequency of bilateral extremity numbness. Acute infarcts scattered throughout the right hemisphere on brain MRI today. EXAM: CT ANGIOGRAPHY NECK TECHNIQUE: Multidetector CT imaging of the neck was performed using the standard protocol during bolus administration of intravenous contrast. Multiplanar CT image reconstructions and MIPs were obtained to evaluate the vascular anatomy. Carotid stenosis measurements (when applicable) are obtained utilizing NASCET criteria, using the distal internal carotid diameter as the denominator. CONTRAST:  75 mL Isovue 370 COMPARISON:  Brain MRI 1647 hours today. FINDINGS: Skeleton: Partially visible levoconvex upper thoracic scoliosis. Mild reversal of cervical lordosis. No acute osseous abnormality identified. Other neck: Scattered centrilobular emphysema in the upper lungs. Small volume of retained secretions in the trachea (series 4, image 195). No superior mediastinal lymphadenopathy. Negative thyroid, larynx, pharynx, parapharyngeal spaces, retropharyngeal space, sublingual space, submandibular glands and parotid glands. No cervical lymphadenopathy. Negative visualized orbit and brain parenchyma. Aortic arch: 3 vessel arch configuration. Mild arch soft plaque. No great vessel origin stenosis. Right carotid system: Negative right CCA origin.  Negative right CCA. Negative right carotid bifurcation. Negative cervical right ICA. The visible right ICA siphon, to the level of the vertical petrous segment, is patent and appears normal. Left carotid system: Negative left CCA. Negative left carotid bifurcation. Negative cervical left ICA. Negative visible left ICA siphon. Vertebral arteries: No proximal right subclavian artery stenosis. Normal right vertebral artery origin. Normal right vertebral artery to the PICA origin. The vessel is mildly diminutive distal to the PICA, but without stenosis to the vertebrobasilar junction. Negative visible basilar artery. No proximal left subclavian artery stenosis. Normal left vertebral artery origin. Negative left vertebral artery to the vertebrobasilar junction. Normal left PICA origin. IMPRESSION: 1. Negative CTA neck aside from mild soft atherosclerotic plaque at the aortic arch. 2. Distal right ICA and intracranial circulation not evaluated on this exam. Electronically Signed   By: Odessa FlemingH  Hall M.D.   On: 05/21/2015 18:37   Mr Laqueta JeanBrain W UJWo Contrast  05/21/2015  CLINICAL DATA:  55 year old male Male with intermittent left hand clumsiness. Recent episode of lower extremity weakness. Numbness and tingling. Symptoms reportedly since 05/17/2015. Initial encounter. EXAM: MRI HEAD WITHOUT AND WITH CONTRAST TECHNIQUE: Multiplanar, multiecho pulse sequences of the brain and surrounding structures were obtained without and with intravenous contrast. CONTRAST:  18mL MULTIHANCE GADOBENATE DIMEGLUMINE 529 MG/ML IV SOLN COMPARISON:  Head CT without contrast 1045 hours today. FINDINGS: Numerous  small areas of cortical and white matter restricted diffusion in the right hemisphere. There is involvement of all lobes of the right hemisphere. The deep gray matter is relatively spared, although there is subtle T2 and diffusion abnormality in the right caudate nucleus. Associated T2 and FLAIR hyperintensity without evidence of hemorrhage. No  mass effect at this time. No contralateral left hemisphere or posterior fossa diffusion abnormality. Major intracranial vascular flow voids are preserved, however, there is suggestion of some abnormal FLAIR signal in right MCA branches on series 9, image 12. Furthermore, there is asymmetric flow related signal in the right ICA on precontrast T1 imaging. Following contrast there is no abnormal enhancement. No dural thickening. No ventriculomegaly. No intracranial mass lesion. Outside of the acute findings above gray and white matter signal appears within normal limits. Negative pituitary, thalami, brainstem, cerebellum, and cervicomedullary junction. No cortical encephalomalacia or chronic cerebral blood products identified. Visible internal auditory structures appear normal. Negative paranasal sinuses and mastoids. Postoperative changes to both globes. Negative scalp soft tissues. Normal bone marrow signal. Negative for age visualized cervical spine. IMPRESSION: 1. Scattered small acute infarcts throughout the right hemisphere. No associated hemorrhage or mass effect. 2. Suggestion of abnormal flow in the right ICA and MCA on these images. CTA head and neck would evaluate further. Electronically Signed   By: Odessa Fleming M.D.   On: 05/21/2015 17:18    Scheduled Meds: . aspirin  300 mg Rectal Daily   Or  . aspirin  325 mg Oral Daily  . atorvastatin  40 mg Oral q1800  . enoxaparin (LOVENOX) injection  40 mg Subcutaneous Q24H   Continuous Infusions: . sodium chloride 75 mL/hr at 05/22/15 0426    Assessment/Plan:  1. 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 Patient had some left-sided numbness confusion and headache. Symptoms seem to have resolved. So far telemetry monitoring is normal sinus rhythm. CT angiographic carotids negative for source of embolism. Still waiting for echocardiogram. Continue aspirin and atorvastatin. Still awaiting physical therapy and occupational therapy  evaluations. 2. Tobacco abuse smoking cessation counseling done 3 minutes by me and nicotine patch refused.  Code Status:     Code Status Orders        Start     Ordered   05/21/15 1836  Full code   Continuous     05/21/15 1835    Code Status History    Date Active Date Inactive Code Status Order ID Comments User Context   This patient has a current code status but no historical code status.     Family Communication: Spoke with wife on the phone in [place] Disposition Plan: Likely home tomorrow  Consultants:  Neurology  Time spent: 25 minutes  Alford Highland  [employer]

## 2015-05-23 LAB — HEMOGLOBIN A1C: Hgb A1c MFr Bld: 5 % (ref 4.0–6.0)

## 2015-05-23 MED ORDER — ATORVASTATIN CALCIUM 40 MG PO TABS
40.0000 mg | ORAL_TABLET | Freq: Every day | ORAL | Status: AC
Start: 2015-05-23 — End: ?

## 2015-05-23 MED ORDER — ASPIRIN 325 MG PO TABS: 325.0000 mg | ORAL_TABLET | Freq: Every day | ORAL | Status: AC

## 2015-05-23 NOTE — Progress Notes (Signed)
MD making rounds. Discharge orders received. IV discontinued. Prescriptions given to patient. Provided with Education Handouts. Discharge paperwork provided, explained, signed and witnessed. No unanswered questions. Discharged via wheelchair by Scientist, research (physical sciences)Auxiliary Staff. Belongings sent with patient.

## 2015-05-23 NOTE — Discharge Summary (Signed)
[facility] - [place] [facility]   PATIENT NAME: Victor Morrison    MR#:  [id number]  DATE OF BIRTH:  09/20/1960  DATE OF ADMISSION:  05/21/2015 ADMITTING PHYSICIAN: Enedina Finner, MD  DATE OF DISCHARGE: 05/23/2015 10:00 AM  PRIMARY CARE PHYSICIAN: Patient follows up in [place]   ADMISSION DIAGNOSIS:  Numbness [R20.0] Numbness and tingling in hands [R20.2] Acute CVA (cerebrovascular accident) (HCC) [I63.9] Transient cerebral ischemia, unspecified transient cerebral ischemia type [G45.9]  DISCHARGE DIAGNOSIS:  Active Problems:   Acute CVA (cerebrovascular accident) (HCC)   SECONDARY DIAGNOSIS:  History reviewed. No pertinent past medical history.  HOSPITAL COURSE:   1. Acute embolic strokes right hemisphere brain. Patient had some left-sided numbness, confusion and headache. Patient's numbness has resolved. Patient's confusion has improved. Patient still occasionally has a slight headache. No source of emboli seen on echocardiogram. CT angios of the carotids were negative for source of embolism. Telemetry monitoring unremarkable. Continue aspirin and atorvastatin. No driving or work for 2 weeks. Must follow up with PMD in [place] prior to return to work. 2. Tobacco abuse patient must stop smoking.  DISCHARGE CONDITIONS:   Fair  CONSULTS OBTAINED:  Treatment Team:  Thana Farr, MD Kym Groom, MD  DRUG ALLERGIES:  No Known Allergies  DISCHARGE MEDICATIONS:   Discharge Medication List as of 05/23/2015  9:48 AM    START taking these medications   Details  aspirin 325 MG tablet Take 1 tablet (325 mg total) by mouth daily., Starting 05/23/2015, Until Discontinued, Print    atorvastatin (LIPITOR) 40 MG tablet Take 1 tablet (40 mg total) by mouth daily at 6 PM., Starting 05/23/2015, Until Discontinued, Print      STOP taking these medications     Chlorphen-Pseudoephed-APAP (CORICIDIN D PO)          DISCHARGE INSTRUCTIONS:    Follow-up PMD one week Have PMD refer you to her neurologist  If you experience worsening of your admission symptoms, develop shortness of breath, life threatening emergency, suicidal or homicidal thoughts you must seek medical attention immediately by calling 911 or calling your MD immediately  if symptoms less severe.  You Must read complete instructions/literature along with all the possible adverse reactions/side effects for all the Medicines you take and that have been prescribed to you. Take any new Medicines after you have completely understood and accept all the possible adverse reactions/side effects.   Please note  You were cared for by a hospitalist during your hospital stay. If you have any questions about your discharge medications or the care you received while you were in the hospital after you are discharged, you can call the unit and asked to speak with the hospitalist on call if the hospitalist that took care of you is not available. Once you are discharged, your primary care physician will handle any further medical issues. Please note that NO REFILLS for any discharge medications will be authorized once you are discharged, as it is imperative that you return to your primary care physician (or establish a relationship with a primary care physician if you do not have one) for your aftercare needs so that they can reassess your need for medications and monitor your lab values.    Today   CHIEF COMPLAINT:   Chief Complaint  Patient presents with  . Numbness    HISTORY OF PRESENT ILLNESS:  Victor Morrison  is a 55 y.o. male no past medical history presented with numbness of the left hand, confusion  and headache and found to have acute embolic strokes.   VITAL SIGNS:  Blood pressure 133/78, pulse 62, temperature 98.4 F (36.9 C), temperature source Oral, resp. rate 18, height 6\' 5"  (1.956 m), weight 90.266 kg (199 lb), SpO2 97 %.    PHYSICAL EXAMINATION:  GENERAL:   55 y.o.-year-old patient lying in the bed with no acute distress.  EYES: Pupils equal, round, reactive to light and accommodation. No scleral icterus. Extraocular muscles intact.  HEENT: Head atraumatic, normocephalic. Oropharynx and nasopharynx clear.  NECK:  Supple, no jugular venous distention. No thyroid enlargement, no tenderness.  LUNGS: Normal breath sounds bilaterally, no wheezing, rales,rhonchi or crepitation. No use of accessory muscles of respiration.  CARDIOVASCULAR: S1, S2 normal. No murmurs, rubs, or gallops.  ABDOMEN: Soft, non-tender, non-distended. Bowel sounds present. No organomegaly or mass.  EXTREMITIES: No pedal edema, cyanosis, or clubbing.  NEUROLOGIC: Cranial nerves II through XII are intact. Muscle strength 5/5 in all extremities. Sensation intact. Gait not checked.  PSYCHIATRIC: The patient is alert and oriented x 3.  SKIN: No obvious rash, lesion, or ulcer.   DATA REVIEW:   CBC  Recent Labs Lab 05/21/15 1017  WBC 10.5  HGB 16.9  HCT 48.2  PLT 194    Chemistries   Recent Labs Lab 05/21/15 1017  NA 138  K 4.4  CL 108  CO2 21*  GLUCOSE 115*  BUN 12  CREATININE 1.06  CALCIUM 9.2    Cardiac Enzymes  Recent Labs Lab 05/21/15 1017  TROPONINI <0.03    RADIOLOGY:  Dg Eye Foreign Body  05/21/2015  CLINICAL DATA:  Metal working/exposure; clearance prior to MRI EXAM: ORBITS FOR FOREIGN BODY - 2 VIEW COMPARISON:  CT scan of the head 05/21/2015 FINDINGS: There is no evidence of metallic foreign body within the orbits. No significant bone abnormality identified. IMPRESSION: No evidence of metallic foreign body within the orbits. Electronically Signed   By: Natasha MeadLiviu  Pop M.D.   On: 05/21/2015 16:24   Ct Angio Neck W/cm &/or Wo/cm  05/21/2015  CLINICAL DATA:  55 year old male with increasing frequency of bilateral extremity numbness. Acute infarcts scattered throughout the right hemisphere on brain MRI today. EXAM: CT ANGIOGRAPHY NECK TECHNIQUE:  Multidetector CT imaging of the neck was performed using the standard protocol during bolus administration of intravenous contrast. Multiplanar CT image reconstructions and MIPs were obtained to evaluate the vascular anatomy. Carotid stenosis measurements (when applicable) are obtained utilizing NASCET criteria, using the distal internal carotid diameter as the denominator. CONTRAST:  75 mL Isovue 370 COMPARISON:  Brain MRI 1647 hours today. FINDINGS: Skeleton: Partially visible levoconvex upper thoracic scoliosis. Mild reversal of cervical lordosis. No acute osseous abnormality identified. Other neck: Scattered centrilobular emphysema in the upper lungs. Small volume of retained secretions in the trachea (series 4, image 195). No superior mediastinal lymphadenopathy. Negative thyroid, larynx, pharynx, parapharyngeal spaces, retropharyngeal space, sublingual space, submandibular glands and parotid glands. No cervical lymphadenopathy. Negative visualized orbit and brain parenchyma. Aortic arch: 3 vessel arch configuration. Mild arch soft plaque. No great vessel origin stenosis. Right carotid system: Negative right CCA origin. Negative right CCA. Negative right carotid bifurcation. Negative cervical right ICA. The visible right ICA siphon, to the level of the vertical petrous segment, is patent and appears normal. Left carotid system: Negative left CCA. Negative left carotid bifurcation. Negative cervical left ICA. Negative visible left ICA siphon. Vertebral arteries: No proximal right subclavian artery stenosis. Normal right vertebral artery origin. Normal right vertebral artery to the PICA origin.  The vessel is mildly diminutive distal to the PICA, but without stenosis to the vertebrobasilar junction. Negative visible basilar artery. No proximal left subclavian artery stenosis. Normal left vertebral artery origin. Negative left vertebral artery to the vertebrobasilar junction. Normal left PICA origin. IMPRESSION: 1.  Negative CTA neck aside from mild soft atherosclerotic plaque at the aortic arch. 2. Distal right ICA and intracranial circulation not evaluated on this exam. Electronically Signed   By: Odessa Fleming M.D.   On: 05/21/2015 18:37   Mr Laqueta Jean ZO Contrast  05/21/2015  CLINICAL DATA:  55 year old male Male with intermittent left hand clumsiness. Recent episode of lower extremity weakness. Numbness and tingling. Symptoms reportedly since 05/17/2015. Initial encounter. EXAM: MRI HEAD WITHOUT AND WITH CONTRAST TECHNIQUE: Multiplanar, multiecho pulse sequences of the brain and surrounding structures were obtained without and with intravenous contrast. CONTRAST:  18mL MULTIHANCE GADOBENATE DIMEGLUMINE 529 MG/ML IV SOLN COMPARISON:  Head CT without contrast 1045 hours today. FINDINGS: Numerous small areas of cortical and white matter restricted diffusion in the right hemisphere. There is involvement of all lobes of the right hemisphere. The deep gray matter is relatively spared, although there is subtle T2 and diffusion abnormality in the right caudate nucleus. Associated T2 and FLAIR hyperintensity without evidence of hemorrhage. No mass effect at this time. No contralateral left hemisphere or posterior fossa diffusion abnormality. Major intracranial vascular flow voids are preserved, however, there is suggestion of some abnormal FLAIR signal in right MCA branches on series 9, image 12. Furthermore, there is asymmetric flow related signal in the right ICA on precontrast T1 imaging. Following contrast there is no abnormal enhancement. No dural thickening. No ventriculomegaly. No intracranial mass lesion. Outside of the acute findings above gray and white matter signal appears within normal limits. Negative pituitary, thalami, brainstem, cerebellum, and cervicomedullary junction. No cortical encephalomalacia or chronic cerebral blood products identified. Visible internal auditory structures appear normal. Negative paranasal  sinuses and mastoids. Postoperative changes to both globes. Negative scalp soft tissues. Normal bone marrow signal. Negative for age visualized cervical spine. IMPRESSION: 1. Scattered small acute infarcts throughout the right hemisphere. No associated hemorrhage or mass effect. 2. Suggestion of abnormal flow in the right ICA and MCA on these images. CTA head and neck would evaluate further. Electronically Signed   By: Odessa Fleming M.D.   On: 05/21/2015 17:18   US Carotid Bilateral  05/22/2015  CLINICAL DATA:  Left-sided weakness, paresthesias, TIA symptoms, right eye visual disturbance. EXAM: BILATERAL CAROTID DUPLEX ULTRASOUND TECHNIQUE: Wallace Cullens scale imaging, color Doppler and duplex ultrasound were performed of bilateral carotid and vertebral arteries in the neck. COMPARISON:  None. FINDINGS: Criteria: Quantification of carotid stenosis is based on velocity parameters that correlate the residual internal carotid diameter with NASCET-based stenosis levels, using the diameter of the distal internal carotid lumen as the denominator for stenosis measurement. The following velocity measurements were obtained: RIGHT ICA:  83/21 cm/sec CCA:  100/22 cm/sec SYSTOLIC ICA/CCA RATIO:  0.8 DIASTOLIC ICA/CCA RATIO:  1.0 ECA:  134 cm/sec LEFT ICA:  89/32 cm/sec CCA:  101/32 cm/sec SYSTOLIC ICA/CCA RATIO:  0.9 DIASTOLIC ICA/CCA RATIO:  1.0 ECA:  119 cm/sec RIGHT CAROTID ARTERY: Minor atherosclerotic plaque formation. No hemodynamically significant right ICA stenosis, velocity elevation, or turbulent flow. Degree of narrowing less than 50%. RIGHT VERTEBRAL ARTERY:  Antegrade LEFT CAROTID ARTERY: Similar scattered minor atherosclerotic plaque formation. No hemodynamically significant left ICA stenosis, velocity elevation, or turbulent flow. LEFT VERTEBRAL ARTERY:  Antegrade IMPRESSION: Minimal carotid atherosclerosis. No  hemodynamically significant ICA stenosis. Degree of narrowing less than 50% bilaterally. Patent antegrade vertebral  flow bilaterally. Electronically Signed   By: Judie Petit.  Shick M.D.   On: 05/22/2015 14:32    Management plans discussed with the patient, and he is in agreement. I spoke with wife yesterday on the phone who was in [place].  CODE STATUS:  Code Status History    Date Active Date Inactive Code Status Order ID Comments User Context   05/21/2015  6:35 PM 05/23/2015  1:04 PM Full Code [id number]  Enedina Finner, MD Inpatient      TOTAL TIME TAKING CARE OF THIS PATIENT: 35 minutes. Greater than 50% of time spent in coordination of care with patient and explaining what needs to be done upon discharge.   Alford Highland M.D on 05/23/2015 at 3:03 PM  Between 7am to 6pm - Pager - [phone]  After 6pm go to [email or web address] - Social research officer, government  [facility] Office  [phone]  CC: Primary care physician; primary care physician in [place].

## 2015-05-23 NOTE — Plan of Care (Signed)
Problem: Tissue Perfusion: Goal: Complications of Ischemic Stroke will be minimized. Choose ONE based on patient diagnosis  Outcome: Progressing No complaints of pain, NIH= 0, Neuro checks WNL, ambulates in room.

## 2015-05-23 NOTE — Discharge Instructions (Signed)
No Driving 2 weeks No work 2 weeks Will need follow up with medical doctor and neurologist prior to determine when you can go back to work Please stay in [place] at your appartment until your wife is able to drive you to [place]

## 2015-05-23 NOTE — Progress Notes (Signed)
Patient ID: Victor Morrison, male   DOB: 05/18/1960, 55 y.o.   MRN: [id number] [facility] [place] [facility]        [name] was admitted to the Hospital on 05/21/2015 and Discharged  05/23/2015 and should be excused from work/school   for 16 days starting 05/21/2015 , Patient will require a follow up with his primary care physician prior to return to work. Also no driving for 2 weeks.  Alford HighlandWIETING, Lukus Binion M.D on 05/23/2015,at 8:55 AM  [facility] [place] [facility]    Office  [phone]

## 2017-09-28 IMAGING — US US CAROTID DUPLEX BILAT
1 series · 13 of 24 positions shown · non-contrast
Comparison: None.

CLINICAL DATA: Left-sided weakness, paresthesias, TIA symptoms,
right eye visual disturbance.

EXAM:
BILATERAL CAROTID DUPLEX ULTRASOUND
TECHNIQUE: Gray scale imaging, color Doppler and duplex ultrasound were
performed of bilateral carotid and vertebral arteries in the neck.

[Series 1: us carotid duplex bilat · 13 of 62 slices shown]
[im 1/62]
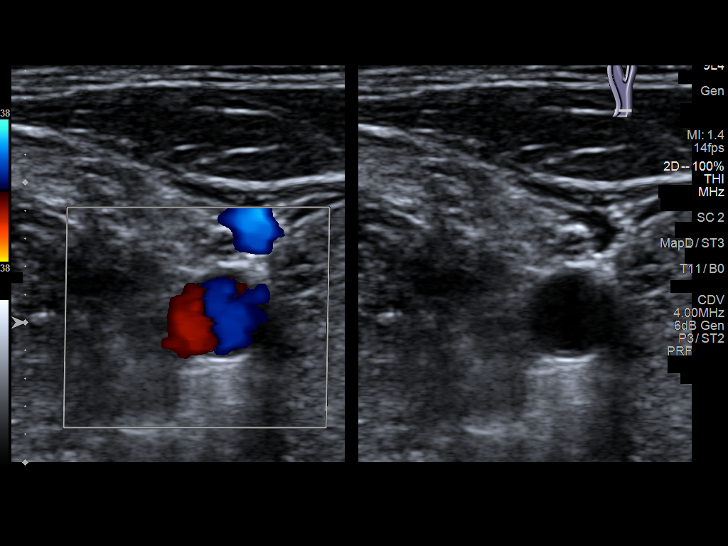
[im 6/62]
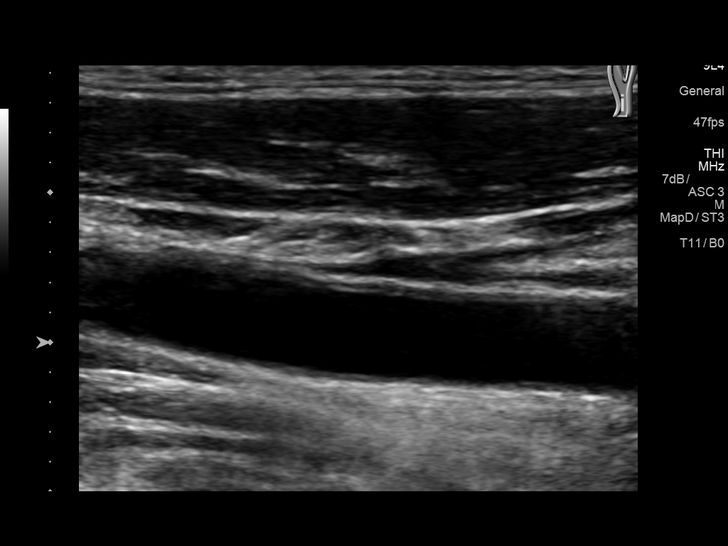
[im 11/62]
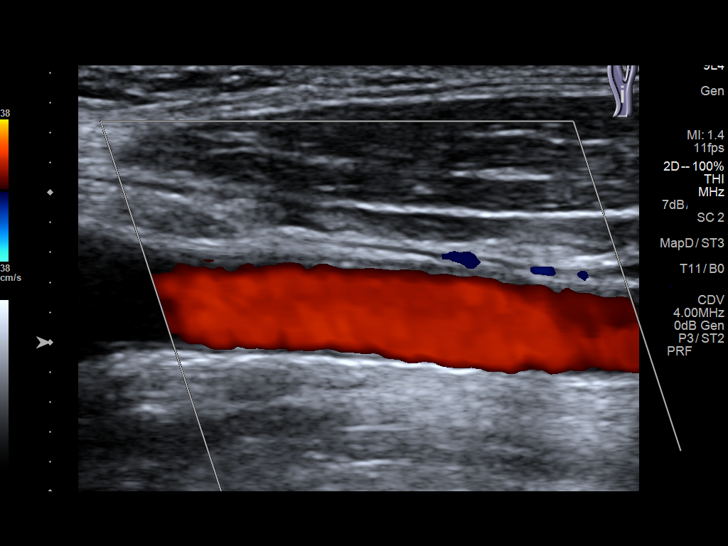
[im 16/62]
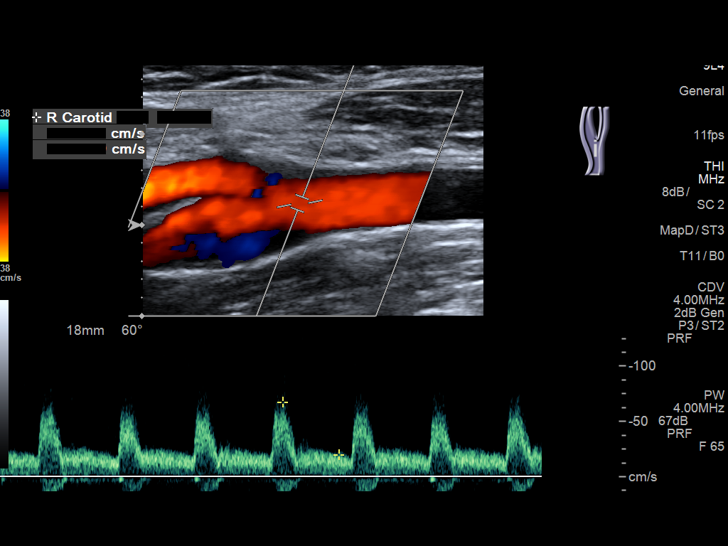
[im 22/62]
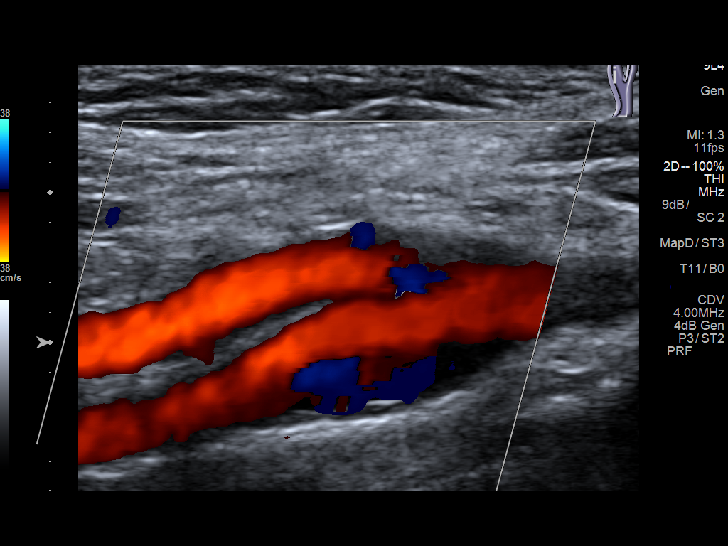
[im 27/62]
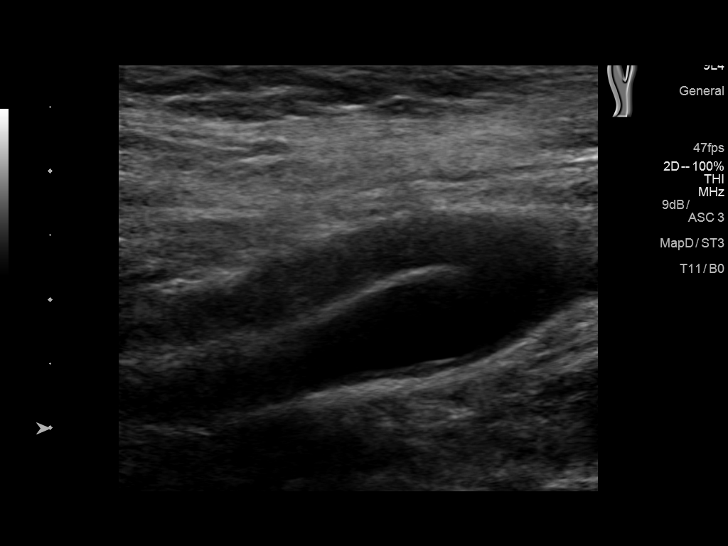
[im 32/62]
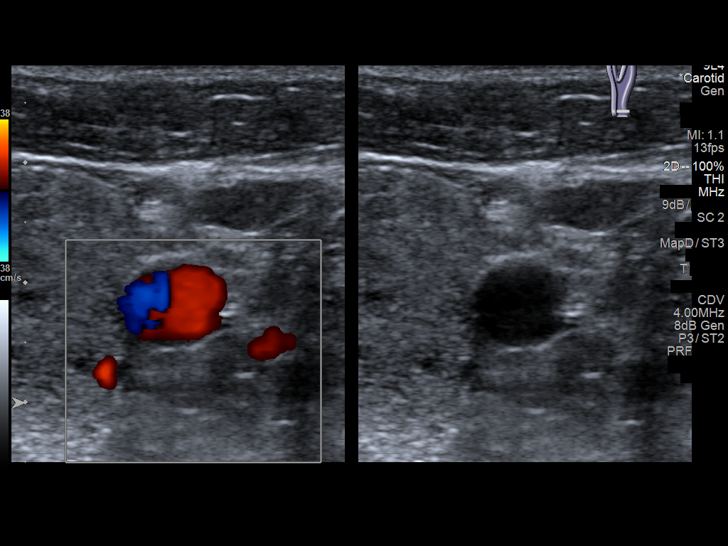
[im 35/62]
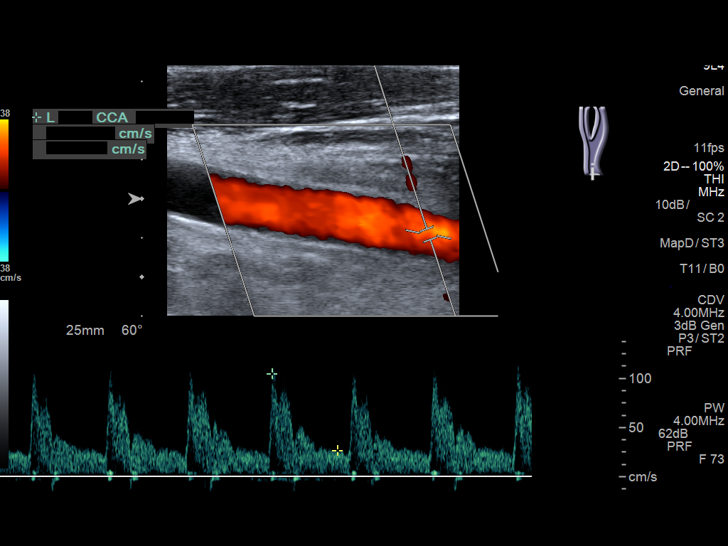
[im 40/62]
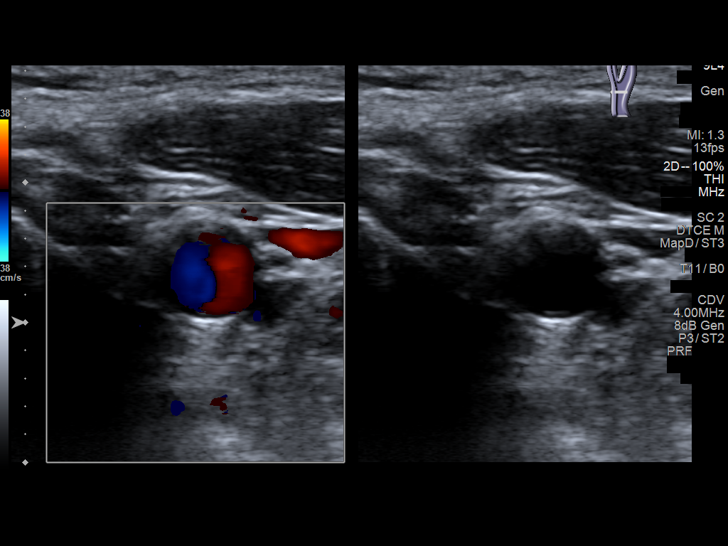
[im 46/62]
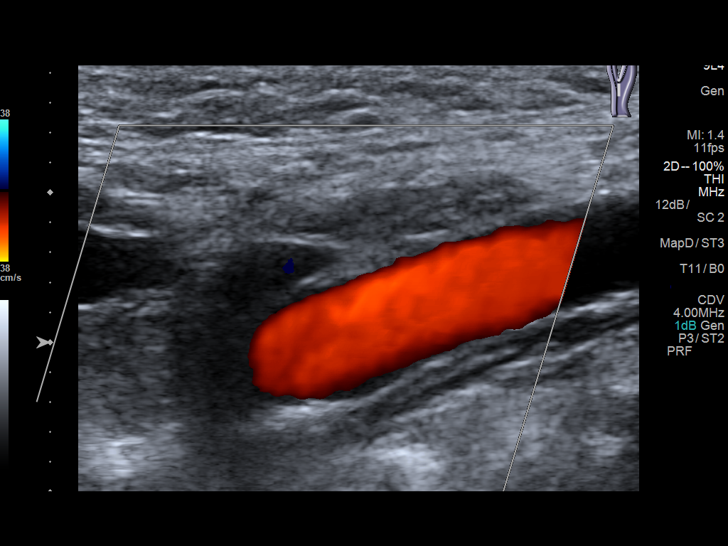
[im 51/62]
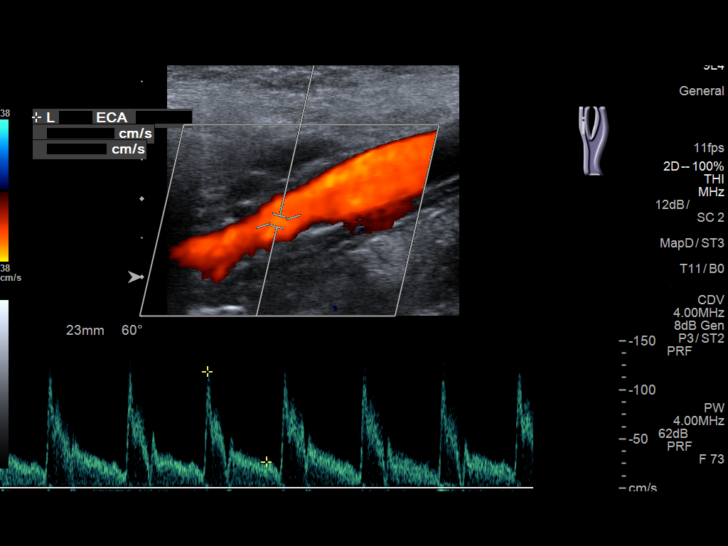
[im 56/62]
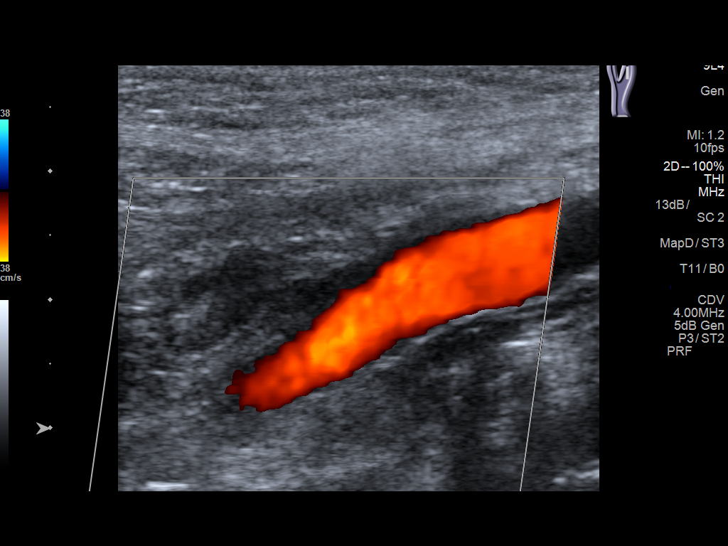
[im 62/62]
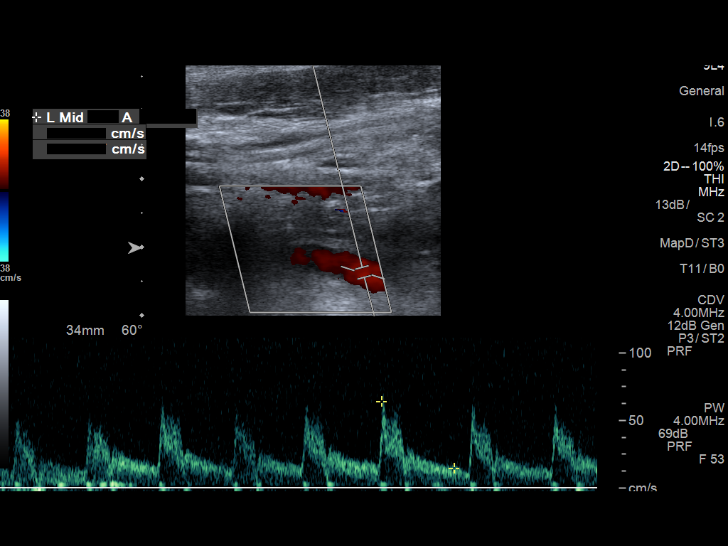

[13 of 24 positions shown; findings below may reference images not displayed]

FINDINGS: Criteria: Quantification of carotid stenosis is based on velocity
parameters that correlate the residual internal carotid diameter
with NASCET-based stenosis levels, using the diameter of the distal
internal carotid lumen as the denominator for stenosis measurement.

The following velocity measurements were obtained:

RIGHT

ICA:  83/21 cm/sec

CCA:  100/22 cm/sec

SYSTOLIC ICA/CCA RATIO:

DIASTOLIC ICA/CCA RATIO:

ECA:  134 cm/sec

LEFT

ICA:  89/32 cm/sec

CCA:  101/32 cm/sec

SYSTOLIC ICA/CCA RATIO:

DIASTOLIC ICA/CCA RATIO:

ECA:  119 cm/sec

RIGHT CAROTID ARTERY: Minor atherosclerotic plaque formation. No
hemodynamically significant right ICA stenosis, velocity elevation,
or turbulent flow. Degree of narrowing less than 50%.

RIGHT VERTEBRAL ARTERY:  Antegrade

LEFT CAROTID ARTERY: Similar scattered minor atherosclerotic plaque
formation. No hemodynamically significant left ICA stenosis,
velocity elevation, or turbulent flow.

LEFT VERTEBRAL ARTERY:  Antegrade
IMPRESSION: Minimal carotid atherosclerosis. No hemodynamically significant ICA
stenosis. Degree of narrowing less than 50% bilaterally.

Patent antegrade vertebral flow bilaterally.

## 2018-01-04 IMAGING — CT CT ANGIO NECK
2 of 7 series · 9 of 33 positions shown · IV contrast (APPLIED)
Comparison: Brain MRI 5590 hours today.

CLINICAL DATA: 54-year-old male with increasing frequency of
bilateral extremity numbness. Acute infarcts scattered throughout
the right hemisphere on brain MRI today.



[Series 4: cta neck · axial · 0.46mm/px · z∈[-256,-106]mm · 5 of 226 slices shown]
[im 38/226  soft-tissue]
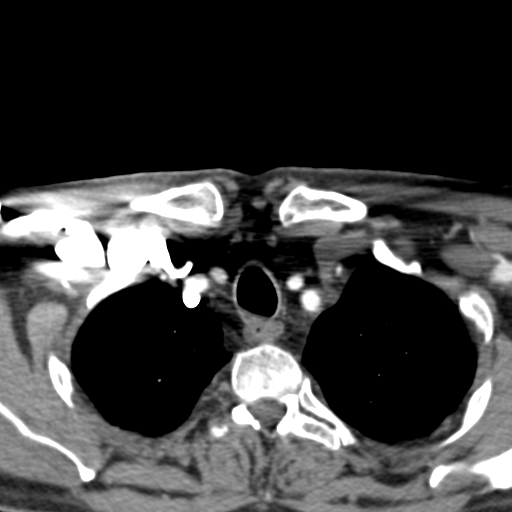
[im 76/226  bone]
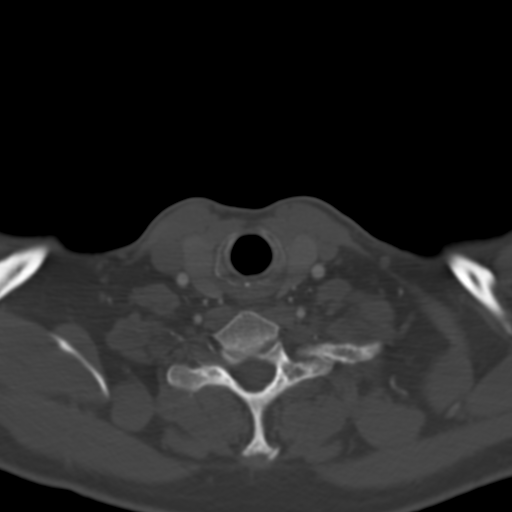
[im 113/226  soft-tissue]
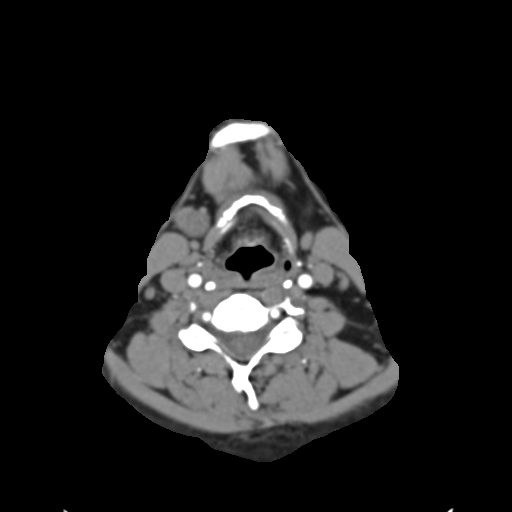
[im 151/226  bone]
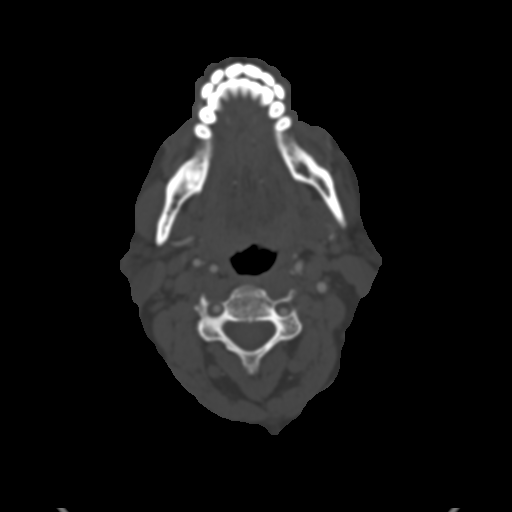
[im 188/226  soft-tissue]
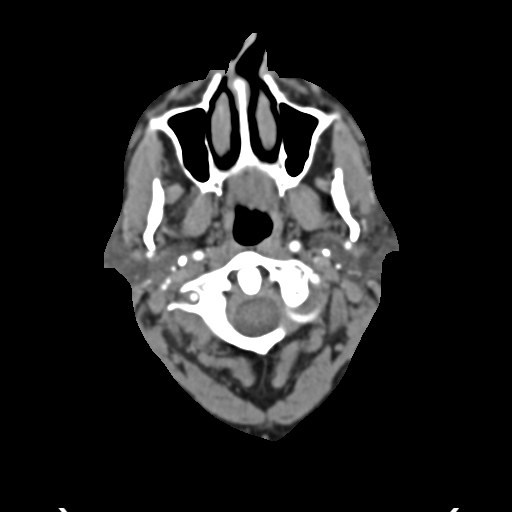

[Series 6: ax thin · axial · 0.27mm/px · z∈[-271,-141]mm · 4 of 231 slices shown]
[im 47/231  soft-tissue]
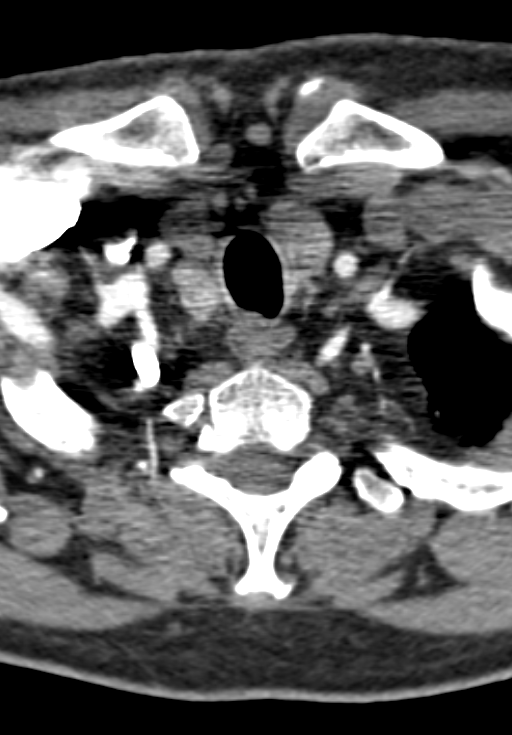
[im 93/231  soft-tissue]
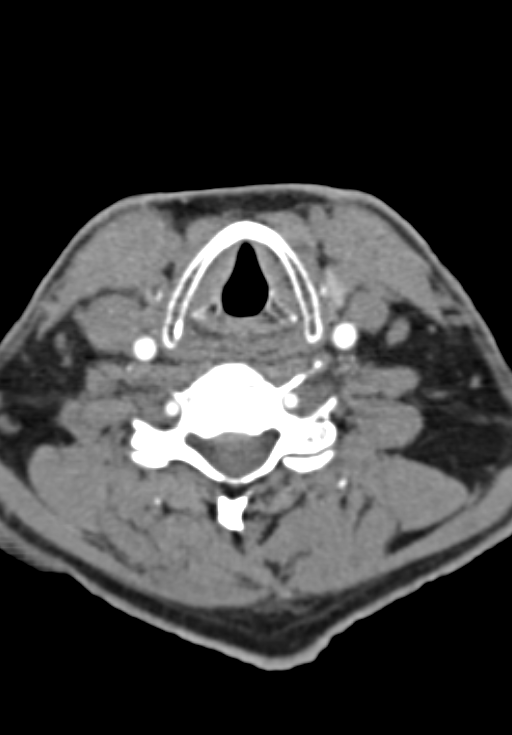
[im 139/231  soft-tissue]
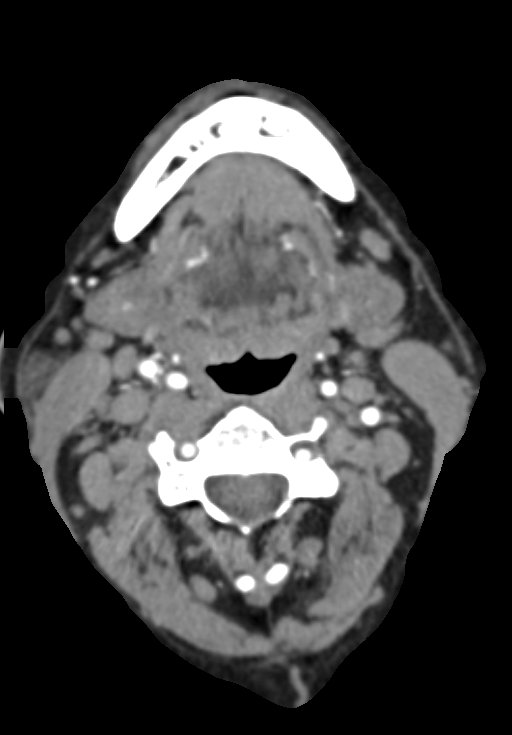
[im 185/231  soft-tissue]
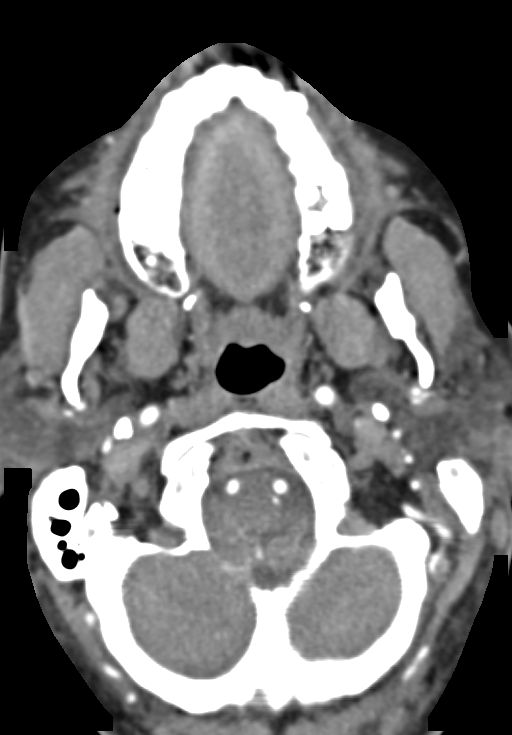

[9 of 33 positions shown; findings below may reference images not displayed]

FINDINGS: Skeleton: Partially visible levoconvex upper thoracic scoliosis.
Mild reversal of cervical lordosis. No acute osseous abnormality
identified.

Other neck: Scattered centrilobular emphysema in the upper lungs.
Small volume of retained secretions in the trachea (series 4, image
195). No superior mediastinal lymphadenopathy.

Negative thyroid, larynx, pharynx, parapharyngeal spaces,
retropharyngeal space, sublingual space, submandibular glands and
parotid glands. No cervical lymphadenopathy. Negative visualized
orbit and brain parenchyma.

Aortic arch: 3 vessel arch configuration. Mild arch soft plaque. No
great vessel origin stenosis.

Right carotid system: Negative right CCA origin. Negative right CCA.
Negative right carotid bifurcation. Negative cervical right ICA. The
visible right ICA siphon, to the level of the vertical petrous
segment, is patent and appears normal.

Left carotid system: Negative left CCA. Negative left carotid
bifurcation. Negative cervical left ICA. Negative visible left ICA
siphon.

Vertebral arteries:

No proximal right subclavian artery stenosis. Normal right vertebral
artery origin. Normal right vertebral artery to the PICA origin. The
vessel is mildly diminutive distal to the PICA, but without stenosis
to the vertebrobasilar junction. Negative visible basilar artery.

No proximal left subclavian artery stenosis. Normal left vertebral
artery origin. Negative left vertebral artery to the vertebrobasilar
junction. Normal left PICA origin.
IMPRESSION: 1. Negative CTA neck aside from mild soft atherosclerotic plaque at
the aortic arch.
2. Distal right ICA and intracranial circulation not evaluated on
this exam.
